# Patient Record
Sex: Female | Born: 1980 | Race: Black or African American | Hispanic: No | Marital: Single | Smoking: Never smoker
Health system: Southern US, Community
[De-identification: ages and names within clinical notes are randomized; demographics above are authoritative.]

## PROBLEM LIST (undated history)

## (undated) DIAGNOSIS — Z98891 History of uterine scar from previous surgery: Secondary | ICD-10-CM

## (undated) DIAGNOSIS — T7840XA Allergy, unspecified, initial encounter: Secondary | ICD-10-CM

## (undated) DIAGNOSIS — J45909 Unspecified asthma, uncomplicated: Secondary | ICD-10-CM

## (undated) HISTORY — DX: Unspecified asthma, uncomplicated: J45.909

## (undated) HISTORY — PX: NO PAST SURGERIES: SHX2092

## (undated) HISTORY — DX: Allergy, unspecified, initial encounter: T78.40XA

---

## 2011-08-03 ENCOUNTER — Ambulatory Visit (INDEPENDENT_AMBULATORY_CARE_PROVIDER_SITE_OTHER): Payer: BC Managed Care – PPO | Admitting: Family Medicine

## 2011-08-03 VITALS — BP 111/74 | HR 82 | Temp 98.0°F | Resp 16 | Ht 63.5 in | Wt 157.0 lb

## 2011-08-03 DIAGNOSIS — K5900 Constipation, unspecified: Secondary | ICD-10-CM

## 2011-08-03 DIAGNOSIS — T7589XA Other specified effects of external causes, initial encounter: Secondary | ICD-10-CM

## 2011-08-03 DIAGNOSIS — IMO0001 Reserved for inherently not codable concepts without codable children: Secondary | ICD-10-CM

## 2011-08-03 DIAGNOSIS — R102 Pelvic and perineal pain: Secondary | ICD-10-CM

## 2011-08-03 LAB — POCT CBC
Granulocyte percent: 65.7 %G (ref 37–80)
MCH, POC: 29.6 pg (ref 27–31.2)
MCV: 92.8 fL (ref 80–97)
MID (cbc): 0.5 (ref 0–0.9)
POC LYMPH PERCENT: 29 %L (ref 10–50)
POC MID %: 5.3 %M (ref 0–12)
Platelet Count, POC: 295 10*3/uL (ref 142–424)
RDW, POC: 13.5 %

## 2011-08-03 LAB — POCT URINALYSIS DIPSTICK
Blood, UA: NEGATIVE
Protein, UA: NEGATIVE
Spec Grav, UA: 1.02
Urobilinogen, UA: 0.2

## 2011-08-03 LAB — POCT UA - MICROSCOPIC ONLY
Crystals, Ur, HPF, POC: NEGATIVE
Yeast, UA: NEGATIVE

## 2011-08-03 LAB — POCT WET PREP WITH KOH
KOH Prep POC: NEGATIVE
Trichomonas, UA: NEGATIVE

## 2011-08-03 LAB — HIV ANTIBODY (ROUTINE TESTING W REFLEX): HIV: NONREACTIVE

## 2011-08-03 LAB — POCT URINE PREGNANCY: Preg Test, Ur: NEGATIVE

## 2011-08-03 MED ORDER — FLUCONAZOLE 150 MG PO TABS: 150.0000 mg | ORAL_TABLET | Freq: Once | ORAL | Status: AC

## 2011-08-03 MED ORDER — METRONIDAZOLE 500 MG PO TABS: 500.0000 mg | ORAL_TABLET | Freq: Two times a day (BID) | ORAL | Status: AC

## 2011-08-03 NOTE — Progress Notes (Signed)
  Subjective:    Patient ID: Sara Newton, female    DOB: 1980/04/22, 31 y.o.   MRN: [id number]  HPI  Patient presents with 1-2 weeks of intermittent suprapubic pain.  Light vaginal discharge  Denies urinary symptoms Denies constipation though  last normal BM 1 week ago No history of ovarian cysts  Significant stressor  Review of Systems     Objective:   Physical Exam  Neck: Neck supple.  Cardiovascular: Normal rate, regular rhythm and normal heart sounds.   Pulmonary/Chest: Effort normal and breath sounds normal.  Abdominal: Soft. There is Tenderness: (B) lower quandrant pain.Marland Kitchen  Neurological: She is alert.  Skin: Skin is warm.           Assessment & Plan:   1. Pelvic pain secondary to BV  POCT CBC, POCT urinalysis dipstick, POCT UA - Microscopic Only, POCT Wet Prep with KOH, POCT urine pregnancy, metroNIDAZOLE (FLAGYL) 500 MG tablet, fluconazole (DIFLUCAN) 150 MG tablet  2. Exposure  GC/chlamydia probe amp, urine, HIV antibody, RPR  3. Constipation

## 2011-08-04 LAB — GC/CHLAMYDIA PROBE AMP, URINE: GC Probe Amp, Urine: NEGATIVE

## 2011-08-05 ENCOUNTER — Encounter: Payer: Self-pay | Admitting: Family Medicine

## 2011-12-31 ENCOUNTER — Other Ambulatory Visit: Payer: Self-pay | Admitting: Physician Assistant

## 2011-12-31 NOTE — Telephone Encounter (Signed)
Chart pulled to PA pool at nurses station [phone]

## 2011-12-31 NOTE — Telephone Encounter (Signed)
Please pull chart.

## 2012-02-01 ENCOUNTER — Other Ambulatory Visit: Payer: Self-pay | Admitting: Physician Assistant

## 2012-02-20 ENCOUNTER — Ambulatory Visit (INDEPENDENT_AMBULATORY_CARE_PROVIDER_SITE_OTHER): Payer: BC Managed Care – PPO | Admitting: Emergency Medicine

## 2012-02-20 VITALS — BP 135/83 | HR 89 | Temp 98.3°F | Resp 16 | Ht 64.0 in | Wt 161.2 lb

## 2012-02-20 DIAGNOSIS — N760 Acute vaginitis: Secondary | ICD-10-CM

## 2012-02-20 DIAGNOSIS — R079 Chest pain, unspecified: Secondary | ICD-10-CM

## 2012-02-20 DIAGNOSIS — Z0000 Encounter for general adult medical examination without abnormal findings: Secondary | ICD-10-CM

## 2012-02-20 DIAGNOSIS — Z0289 Encounter for other administrative examinations: Secondary | ICD-10-CM

## 2012-02-20 DIAGNOSIS — E559 Vitamin D deficiency, unspecified: Secondary | ICD-10-CM

## 2012-02-20 DIAGNOSIS — N898 Other specified noninflammatory disorders of vagina: Secondary | ICD-10-CM

## 2012-02-20 LAB — POCT CBC
Hemoglobin: 13.3 g/dL (ref 12.2–16.2)
Lymph, poc: 2.7 (ref 0.6–3.4)
MCH, POC: 29.8 pg (ref 27–31.2)
MCHC: 31.3 g/dL — AB (ref 31.8–35.4)
MCV: 95 fL (ref 80–97)
POC MID %: 6.3 %M (ref 0–12)
RBC: 4.47 M/uL (ref 4.04–5.48)
WBC: 6.6 10*3/uL (ref 4.6–10.2)

## 2012-02-20 LAB — POCT UA - MICROSCOPIC ONLY
Casts, Ur, LPF, POC: NEGATIVE
Crystals, Ur, HPF, POC: NEGATIVE
Mucus, UA: POSITIVE
Yeast, UA: NEGATIVE

## 2012-02-20 LAB — COMPREHENSIVE METABOLIC PANEL
ALT: 16 U/L (ref 0–35)
Albumin: 4.4 g/dL (ref 3.5–5.2)
CO2: 26 mEq/L (ref 19–32)
Calcium: 9.2 mg/dL (ref 8.4–10.5)
Chloride: 105 mEq/L (ref 96–112)
Glucose, Bld: 83 mg/dL (ref 70–99)
Potassium: 4.5 mEq/L (ref 3.5–5.3)
Sodium: 137 mEq/L (ref 135–145)
Total Bilirubin: 0.3 mg/dL (ref 0.3–1.2)
Total Protein: 7.2 g/dL (ref 6.0–8.3)

## 2012-02-20 LAB — POCT WET PREP WITH KOH
KOH Prep POC: NEGATIVE
Trichomonas, UA: NEGATIVE

## 2012-02-20 LAB — POCT URINALYSIS DIPSTICK
Bilirubin, UA: NEGATIVE
Glucose, UA: NEGATIVE
Spec Grav, UA: 1.015
Urobilinogen, UA: 0.2

## 2012-02-20 LAB — LIPID PANEL
Cholesterol: 162 mg/dL (ref 0–200)
VLDL: 7 mg/dL (ref 0–40)

## 2012-02-20 LAB — HIV ANTIBODY (ROUTINE TESTING W REFLEX): HIV: NONREACTIVE

## 2012-02-20 LAB — TSH: TSH: 1.91 u[IU]/mL (ref 0.350–4.500)

## 2012-02-20 MED ORDER — METRONIDAZOLE 500 MG PO TABS: 500.0000 mg | ORAL_TABLET | Freq: Two times a day (BID) | ORAL | Status: DC

## 2012-02-20 MED ORDER — LEVONORGESTREL-ETHINYL ESTRAD 0.1-20 MG-MCG PO TABS: 1.0000 | ORAL_TABLET | Freq: Every day | ORAL | Status: DC

## 2012-02-20 NOTE — Addendum Note (Signed)
Addended by: Cira Rue R on: 02/20/2012 11:45 AM   Modules accepted: Orders

## 2012-02-20 NOTE — Progress Notes (Signed)
[facility] [address], [place] [address] [phone]  Date:  02/20/2012   Name:  Sara Newton   DOB:  March 26, 1980   MRN:  [id number]  PCP:  No primary provider on file.    Chief Complaint: Annual Exam   History of Present Illness:  Sara Newton is a 31 y.o. very pleasant female patient who presents with the following:  Two issues; requests an annual physical and describes recent onset of chest discomfort that has been intermittent.  Says that it has been problematic for the past week or so.  Has experienced similar episodes in past.  Not associated with tachycardia or palpitations, shortness of breath, wheezing, nausea, diaphoresis.  No cough or coryza.  History of asthma that only responds to maxair MDI and nebulized aerosol treatments.  Last used her neb a month ago.  No family history of accelerated cardiac disease.  Patient nonsmoker, no history of immobilization or travel or surgery.    Denies other concerns or complaints referable to her annual exam.  There is no problem list on file for this patient.   Past Medical History  Diagnosis Date  . Allergy   . Asthma     No past surgical history on file.  History  Substance Use Topics  . Smoking status: Never Smoker   . Smokeless tobacco: Not on file  . Alcohol Use: Not on file    Family History  Problem Relation Age of Onset  . Hypertension Mother   . Hypertension Father   . Dementia Paternal Grandfather     No Known Allergies  Medication list has been reviewed and updated.  Current Outpatient Prescriptions on File Prior to Visit  Medication Sig Dispense Refill  . albuterol (PROVENTIL) (2.5 MG/3ML) 0.083% nebulizer solution Take 2.5 mg by nebulization every 6 (six) hours as needed.      . fexofenadine (ALLEGRA) 180 MG tablet Take 180 mg by mouth daily.      . mometasone (NASONEX) 50 MCG/ACT nasal spray Place 2 sprays into the nose daily.      . ORSYTHIA 0.1-20 MG-MCG  tablet TAKE 1 TABLET BY MOUTH DAILY. NEEDS PHYSICAL/LABS  28 tablet  0    Review of Systems:  As per HPI, otherwise negative.    Physical Examination: Filed Vitals:   02/20/12 1019  BP: 135/83  Pulse: 89  Temp: 98.3 F (36.8 C)  Resp: 16   Filed Vitals:   02/20/12 1019  Height: 5\' 4"  (1.626 m)  Weight: 161 lb 3.2 oz (73.12 kg)   Body mass index is 27.67 kg/(m^2). Ideal Body Weight: Weight in (lb) to have BMI = 25: 145.3   GEN: WDWN, NAD, Non-toxic, A & O x 3 HEENT: Atraumatic, Normocephalic. Neck supple. No masses, No LAD. Ears and Nose: No external deformity. CV: RRR, No M/G/R. No JVD. No thrill. No extra heart sounds. PULM: CTA B, no wheezes, crackles, rhonchi. No retractions. No resp. distress. No accessory muscle use. ABD: S, NT, ND, +BS. No rebound. No HSM. EXTR: No c/c/e NEURO Normal gait.  PSYCH: Normally interactive. Conversant. Not depressed or anxious appearing.  Calm demeanor.   Pelvic - normal external genitalia, vulva, vagina, cervix, uterus and adnexa. Thick white vaginal discharge   Assessment and Plan: Chest pain EKG and Echo Labs  Physical Labs PAP  Vaginal discharge Wet prep  Carmelina Dane, MD

## 2012-02-20 NOTE — Addendum Note (Signed)
Addended by: Carmelina Dane on: 02/20/2012 11:42 AM   Modules accepted: Orders

## 2012-02-21 DIAGNOSIS — R079 Chest pain, unspecified: Secondary | ICD-10-CM | POA: Insufficient documentation

## 2012-02-21 DIAGNOSIS — N760 Acute vaginitis: Secondary | ICD-10-CM | POA: Insufficient documentation

## 2012-02-21 DIAGNOSIS — E559 Vitamin D deficiency, unspecified: Secondary | ICD-10-CM | POA: Insufficient documentation

## 2012-02-21 LAB — VITAMIN D 25 HYDROXY (VIT D DEFICIENCY, FRACTURES): Vit D, 25-Hydroxy: 21 ng/mL — ABNORMAL LOW (ref 30–89)

## 2012-02-21 MED ORDER — ERGOCALCIFEROL 1.25 MG (50000 UT) PO CAPS: 50000.0000 [IU] | ORAL_CAPSULE | ORAL | Status: DC

## 2012-02-21 NOTE — Addendum Note (Signed)
Addended by: Carmelina Dane on: 02/21/2012 10:08 AM   Modules accepted: Orders

## 2012-02-23 LAB — PAP IG, CT-NG, RFX HPV ASCU: GC Probe Amp: NEGATIVE

## 2012-03-11 ENCOUNTER — Ambulatory Visit (HOSPITAL_COMMUNITY): Payer: BC Managed Care – PPO | Attending: Cardiology | Admitting: Radiology

## 2012-03-11 DIAGNOSIS — I059 Rheumatic mitral valve disease, unspecified: Secondary | ICD-10-CM | POA: Insufficient documentation

## 2012-03-11 DIAGNOSIS — R079 Chest pain, unspecified: Secondary | ICD-10-CM

## 2012-03-11 DIAGNOSIS — I079 Rheumatic tricuspid valve disease, unspecified: Secondary | ICD-10-CM | POA: Insufficient documentation

## 2012-03-11 DIAGNOSIS — R072 Precordial pain: Secondary | ICD-10-CM | POA: Insufficient documentation

## 2012-03-11 NOTE — Progress Notes (Signed)
Echocardiogram performed.  

## 2012-05-23 ENCOUNTER — Ambulatory Visit (INDEPENDENT_AMBULATORY_CARE_PROVIDER_SITE_OTHER): Payer: BC Managed Care – PPO | Admitting: Emergency Medicine

## 2012-05-23 VITALS — BP 112/78 | HR 86 | Temp 98.2°F | Resp 16 | Ht 63.0 in | Wt 163.0 lb

## 2012-05-23 DIAGNOSIS — M25521 Pain in right elbow: Secondary | ICD-10-CM

## 2012-05-23 LAB — POCT CBC
Granulocyte percent: 50.1 %G (ref 37–80)
HCT, POC: 38.5 % (ref 37.7–47.9)
Lymph, poc: 2.9 (ref 0.6–3.4)
MCHC: 30.9 g/dL — AB (ref 31.8–35.4)
MID (cbc): 0.7 (ref 0–0.9)
POC Granulocyte: 3.6 (ref 2–6.9)
POC LYMPH PERCENT: 40.5 %L (ref 10–50)
POC MID %: 9.4 %M (ref 0–12)
Platelet Count, POC: 234 10*3/uL (ref 142–424)
RDW, POC: 12.9 %

## 2012-05-23 MED ORDER — NAPROXEN SODIUM 550 MG PO TABS: 550.0000 mg | ORAL_TABLET | Freq: Two times a day (BID) | ORAL | Status: DC

## 2012-05-23 NOTE — Patient Instructions (Addendum)
Olecranon Bursitis Bursitis is swelling and soreness (inflammation) of a fluid-filled sac (bursa) that covers and protects a joint. Olecranon bursitis occurs over the elbow.  CAUSES Bursitis can be caused by injury, overuse of the joint, arthritis, or infection.  SYMPTOMS   Tenderness, swelling, warmth, or redness over the elbow.  Elbow pain with movement. This is greater with bending the elbow.  Squeaking sound when the bursa is rubbed or moved.  Increasing size of the bursa without pain or discomfort.  Fever with increasing pain and swelling if the bursa becomes infected. HOME CARE INSTRUCTIONS   Put ice on the affected area.  Put ice in a plastic bag.  Place a towel between your skin and the bag.  Leave the ice on for 15 to 20 minutes each hour while awake. Do this for the first 2 days.  When resting, elevate your elbow above the level of your heart. This helps reduce swelling.  Continue to put the joint through a full range of motion 4 times per day. Rest the injured joint at other times. When the pain lessens, begin normal slow movements and usual activities.  Only take over-the-counter or prescription medicines for pain, discomfort, or fever as directed by your caregiver.  Reduce your intake of milk and related dairy products (cheese, yogurt). They may make your condition worse. SEEK IMMEDIATE MEDICAL CARE IF:   Your pain increases even during treatment.  You have a fever.  You have heat and inflammation over the bursa and elbow.  You have a red line that goes up your arm.  You have pain with movement of your elbow. MAKE SURE YOU:   Understand these instructions.  Will watch your condition.  Will get help right away if you are not doing well or get worse. Document Released: 03/26/2006 Document Revised: 05/19/2011 Document Reviewed: 02/09/2007 [employer] Patient Information 2013 [employer].  

## 2012-05-23 NOTE — Progress Notes (Signed)
[facility] [address] [place] [address] [phone]  Date:  05/23/2012   Name:  Sara Newton   DOB:  Nov 25, 1980   MRN:  [id number]  PCP:  No primary provider on file.    Chief Complaint: Rash   History of Present Illness:  Sara Newton is a 32 y.o. very pleasant female patient who presents with the following:  Sudden swelling and redness of right olecranon with effusion.  Redness and swelling have actually improved since yesterday.  No lymphangitis or fever or chills. No history of injury or overuse or antecedent infection.  No improvement with over the counter medications or other home remedies. Denies other complaint or health concern today.   Patient Active Problem List  Diagnosis  . Hypovitaminosis D  . BV (bacterial vaginosis)  . Chest pain    Past Medical History  Diagnosis Date  . Allergy   . Asthma     History reviewed. No pertinent past surgical history.  History  Substance Use Topics  . Smoking status: Never Smoker   . Smokeless tobacco: Not on file  . Alcohol Use: Yes     Comment: socially    Family History  Problem Relation Age of Onset  . Hypertension Mother   . Hypertension Father   . Dementia Paternal Grandfather   . Hypertension Sister     No Known Allergies  Medication list has been reviewed and updated.  Current Outpatient Prescriptions on File Prior to Visit  Medication Sig Dispense Refill  . albuterol (PROVENTIL) (2.5 MG/3ML) 0.083% nebulizer solution Take 2.5 mg by nebulization every 6 (six) hours as needed.      . ergocalciferol (VITAMIN D2) 50000 UNITS capsule Take 1 capsule (50,000 Units total) by mouth once a week.  4 capsule  12  . fexofenadine (ALLEGRA) 180 MG tablet Take 180 mg by mouth daily.      Marland Kitchen levonorgestrel-ethinyl estradiol (ORSYTHIA) 0.1-20 MG-MCG tablet Take 1 tablet by mouth daily. TAKE 1 TABLET BY MOUTH DAILY.  28 tablet  12  . mometasone (NASONEX) 50 MCG/ACT nasal spray Place  2 sprays into the nose daily.      . metroNIDAZOLE (FLAGYL) 500 MG tablet Take 1 tablet (500 mg total) by mouth 2 (two) times daily with a meal. DO NOT CONSUME ALCOHOL WHILE TAKING THIS MEDICATION.  14 tablet  0   No current facility-administered medications on file prior to visit.    Review of Systems:  As per HPI, otherwise negative.    Physical Examination: Filed Vitals:   05/23/12 1654  BP: 112/78  Pulse: 86  Temp: 98.2 F (36.8 C)  Resp: 16   Filed Vitals:   05/23/12 1654  Height: 5\' 3"  (1.6 m)  Weight: 163 lb (73.936 kg)   Body mass index is 28.88 kg/(m^2). Ideal Body Weight: Weight in (lb) to have BMI = 25: 140.8   GEN: WDWN, NAD, Non-toxic, Alert & Oriented x 3 HEENT: Atraumatic, Normocephalic.  Ears and Nose: No external deformity. EXTR: No clubbing/cyanosis/edema NEURO: Normal gait.  PSYCH: Normally interactive. Conversant. Not depressed or anxious appearing.  Calm demeanor.  RIGHT ELBOW:  Moderate effusion olecranon with erythema surrounding.  No tenderness or lymphangitis  Assessment and Plan: Olecranon bursitis Anaprox Local heat Follow up in one week  Carmelina Dane, MD  Results for orders placed in visit on 05/23/12  POCT CBC      Result Value Range   WBC 7.1  4.6 -  10.2 K/uL   Lymph, poc 2.9  0.6 - 3.4   POC LYMPH PERCENT 40.5  10 - 50 %L   MID (cbc) 0.7  0 - 0.9   POC MID % 9.4  0 - 12 %M   POC Granulocyte 3.6  2 - 6.9   Granulocyte percent 50.1  37 - 80 %G   RBC 4.03 (*) 4.04 - 5.48 M/uL   Hemoglobin 11.9 (*) 12.2 - 16.2 g/dL   HCT, POC 45.4  09.8 - 47.9 %   MCV 95.5  80 - 97 fL   MCH, POC 29.5  27 - 31.2 pg   MCHC 30.9 (*) 31.8 - 35.4 g/dL   RDW, POC 11.9     Platelet Count, POC 234  142 - 424 K/uL   MPV 11.3  0 - 99.8 fL

## 2012-10-14 ENCOUNTER — Emergency Department (HOSPITAL_COMMUNITY): Payer: BC Managed Care – PPO

## 2012-10-14 ENCOUNTER — Encounter (HOSPITAL_COMMUNITY): Payer: Self-pay | Admitting: Emergency Medicine

## 2012-10-14 ENCOUNTER — Emergency Department (HOSPITAL_COMMUNITY)
Admission: EM | Admit: 2012-10-14 | Discharge: 2012-10-14 | Disposition: A | Discharge status: Home / Self Care | Payer: BC Managed Care – PPO | Attending: Emergency Medicine | Admitting: Emergency Medicine

## 2012-10-14 DIAGNOSIS — M549 Dorsalgia, unspecified: Secondary | ICD-10-CM

## 2012-10-14 DIAGNOSIS — M545 Low back pain, unspecified: Secondary | ICD-10-CM | POA: Insufficient documentation

## 2012-10-14 DIAGNOSIS — J45909 Unspecified asthma, uncomplicated: Secondary | ICD-10-CM | POA: Insufficient documentation

## 2012-10-14 DIAGNOSIS — Z79899 Other long term (current) drug therapy: Secondary | ICD-10-CM | POA: Insufficient documentation

## 2012-10-14 DIAGNOSIS — R109 Unspecified abdominal pain: Secondary | ICD-10-CM | POA: Insufficient documentation

## 2012-10-14 DIAGNOSIS — R52 Pain, unspecified: Secondary | ICD-10-CM | POA: Insufficient documentation

## 2012-10-14 DIAGNOSIS — Z3202 Encounter for pregnancy test, result negative: Secondary | ICD-10-CM | POA: Insufficient documentation

## 2012-10-14 LAB — WET PREP, GENITAL: Yeast Wet Prep HPF POC: NONE SEEN

## 2012-10-14 LAB — CBC
MCH: 31.7 pg (ref 26.0–34.0)
MCV: 92.7 fL (ref 78.0–100.0)
Platelets: 240 10*3/uL (ref 150–400)
RDW: 13.2 % (ref 11.5–15.5)

## 2012-10-14 LAB — COMPREHENSIVE METABOLIC PANEL
AST: 16 U/L (ref 0–37)
Albumin: 3.9 g/dL (ref 3.5–5.2)
Calcium: 8.9 mg/dL (ref 8.4–10.5)
Creatinine, Ser: 0.76 mg/dL (ref 0.50–1.10)
Sodium: 140 mEq/L (ref 135–145)
Total Protein: 7.4 g/dL (ref 6.0–8.3)

## 2012-10-14 LAB — URINALYSIS, ROUTINE W REFLEX MICROSCOPIC
Glucose, UA: NEGATIVE mg/dL
Hgb urine dipstick: NEGATIVE
Specific Gravity, Urine: 1.019 (ref 1.005–1.030)
Urobilinogen, UA: 0.2 mg/dL (ref 0.0–1.0)
pH: 7 (ref 5.0–8.0)

## 2012-10-14 LAB — POCT PREGNANCY, URINE: Preg Test, Ur: NEGATIVE

## 2012-10-14 MED ORDER — HYDROCODONE-ACETAMINOPHEN 5-325 MG PO TABS
2.0000 | ORAL_TABLET | Freq: Once | ORAL | Status: AC
  Administered 2012-10-14: 2 via ORAL
  Filled 2012-10-14: qty 2

## 2012-10-14 MED ORDER — KETOROLAC TROMETHAMINE 60 MG/2ML IM SOLN
60.0000 mg | Freq: Once | INTRAMUSCULAR | Status: AC
  Administered 2012-10-14: 60 mg via INTRAMUSCULAR
  Filled 2012-10-14: qty 2

## 2012-10-14 MED ORDER — CYCLOBENZAPRINE HCL 10 MG PO TABS
5.0000 mg | ORAL_TABLET | Freq: Once | ORAL | Status: AC
  Administered 2012-10-14: 5 mg via ORAL
  Filled 2012-10-14: qty 1

## 2012-10-14 MED ORDER — HYDROCODONE-ACETAMINOPHEN 5-325 MG PO TABS: 2.0000 | ORAL_TABLET | ORAL | Status: DC | PRN

## 2012-10-14 MED ORDER — CYCLOBENZAPRINE HCL 10 MG PO TABS: 10.0000 mg | ORAL_TABLET | Freq: Two times a day (BID) | ORAL | Status: DC | PRN

## 2012-10-14 NOTE — Progress Notes (Signed)
Patient confirms she does not have a pcp.  Patient confirms she has Acupuncturist for insurance.  Instructed patient to call the telephone number on the back of her insurance card or go the insurance carrier website to help her find a pcp within network.  Patient verbalized understanding.

## 2012-10-14 NOTE — ED Provider Notes (Signed)
CSN: [id number]     Arrival date & time 10/14/12  1740 History     First MD Initiated Contact with Patient 10/14/12 1748     Chief Complaint  Patient presents with  . Flank Pain  . Back Pain   (Consider location/radiation/quality/duration/timing/severity/associated sxs/prior Treatment) HPI Comments: Pt presents w/ BL low back pain w/ radiation around lower abdomen, intermittent for 3 weeks, but much worse suddenly today while doing pilates.  Denies fever, chills, n/v, d/a, dysuria, vag bleeding or d/c.   Patient is a 32 y.o. female presenting with flank pain and back pain. The history is provided by the patient. No language interpreter was used.  Flank Pain This is a new problem. The current episode started more than 1 week ago (3 weeks). The problem occurs daily. The problem has been rapidly worsening. Associated symptoms include abdominal pain. Pertinent negatives include no chest pain, no headaches and no shortness of breath. The symptoms are aggravated by twisting. The symptoms are relieved by acetaminophen. She has tried acetaminophen for the symptoms. The treatment provided mild relief.  Back Pain Associated symptoms: abdominal pain   Associated symptoms: no chest pain, no dysuria, no fever, no headaches, no numbness, no pelvic pain and no weakness     Past Medical History  Diagnosis Date  . Allergy   . Asthma    History reviewed. No pertinent past surgical history. Family History  Problem Relation Age of Onset  . Hypertension Mother   . Hypertension Father   . Dementia Paternal Grandfather   . Hypertension Sister    History  Substance Use Topics  . Smoking status: Never Smoker   . Smokeless tobacco: Not on file  . Alcohol Use: Yes     Comment: socially   OB History   Grav Para Term Preterm Abortions TAB SAB Ect Mult Living                 Review of Systems  Constitutional: Negative for fever, chills, diaphoresis, activity change, appetite change and fatigue.    HENT: Negative for congestion, sore throat, facial swelling, rhinorrhea, neck pain and neck stiffness.   Eyes: Negative for photophobia and discharge.  Respiratory: Negative for cough, chest tightness and shortness of breath.   Cardiovascular: Negative for chest pain, palpitations and leg swelling.  Gastrointestinal: Positive for abdominal pain. Negative for nausea, vomiting and diarrhea.  Endocrine: Negative for polydipsia and polyuria.  Genitourinary: Positive for flank pain. Negative for dysuria, frequency, difficulty urinating and pelvic pain.  Musculoskeletal: Positive for back pain. Negative for arthralgias.  Skin: Negative for color change and wound.  Allergic/Immunologic: Negative for immunocompromised state.  Neurological: Negative for facial asymmetry, weakness, numbness and headaches.  Hematological: Does not bruise/bleed easily.  Psychiatric/Behavioral: Negative for confusion and agitation.    Allergies  Review of patient's allergies indicates no known allergies.  Home Medications   Current Outpatient Rx  Name  Route  Sig  Dispense  Refill  . albuterol (PROVENTIL) (2.5 MG/3ML) 0.083% nebulizer solution   Nebulization   Take 2.5 mg by nebulization every 6 (six) hours as needed for wheezing or shortness of breath.          . fexofenadine (ALLEGRA) 180 MG tablet   Oral   Take 180 mg by mouth daily as needed (for allergies).          . mometasone (NASONEX) 50 MCG/ACT nasal spray   Nasal   Place 2 sprays into the nose daily.         Marland Kitchen  Multiple Vitamin (MULTIVITAMIN WITH MINERALS) TABS tablet   Oral   Take 1 tablet by mouth daily.         . pirbuterol (MAXAIR) 200 MCG/INH inhaler   Inhalation   Inhale 2 puffs into the lungs 4 (four) times daily as needed for wheezing or shortness of breath.         . thiamine 100 MG tablet   Oral   Take 100 mg by mouth daily.         . Vitamin D, Ergocalciferol, (DRISDOL) 50000 UNITS CAPS capsule   Oral   Take  50,000 Units by mouth every 7 (seven) days. Taken on Wednesdays.         . cyclobenzaprine (FLEXERIL) 10 MG tablet   Oral   Take 1 tablet (10 mg total) by mouth 2 (two) times daily as needed for muscle spasms.   20 tablet   0   . HYDROcodone-acetaminophen (NORCO) 5-325 MG per tablet   Oral   Take 2 tablets by mouth every 4 (four) hours as needed for pain.   15 tablet   0    BP 128/74  Pulse 75  Temp(Src) 98.5 F (36.9 C) (Oral)  Resp 20  SpO2 100%  LMP 10/03/2012 Physical Exam  Constitutional: She is oriented to person, place, and time. She appears well-developed and well-nourished. No distress.  HENT:  Head: Normocephalic and atraumatic.  Mouth/Throat: No oropharyngeal exudate.  Eyes: Pupils are equal, round, and reactive to light.  Neck: Normal range of motion. Neck supple.  Cardiovascular: Normal rate, regular rhythm and normal heart sounds.  Exam reveals no gallop and no friction rub.   No murmur heard. Pulmonary/Chest: Effort normal and breath sounds normal. No respiratory distress. She has no wheezes. She has no rales.  Abdominal: Soft. Bowel sounds are normal. She exhibits no distension and no mass. There is tenderness in the epigastric area and suprapubic area. There is no rigidity, no rebound and no guarding.  Genitourinary: Vagina normal and uterus normal. Cervix exhibits discharge (small amt, white). Cervix exhibits no motion tenderness and no friability. Right adnexum displays no tenderness and no fullness. Left adnexum displays no tenderness and no fullness. No tenderness or bleeding around the vagina.  Musculoskeletal: Normal range of motion. She exhibits no edema and no tenderness.       Back:  Neurological: She is alert and oriented to person, place, and time.  Skin: Skin is warm and dry.  Psychiatric: She has a normal mood and affect.    ED Course   Procedures (including critical care time)  Labs Reviewed  WET PREP, GENITAL - Abnormal; Notable for the  following:    Clue Cells Wet Prep HPF POC RARE (*)    WBC, Wet Prep HPF POC RARE (*)    All other components within normal limits  URINE CULTURE  GC/CHLAMYDIA PROBE AMP  CBC  COMPREHENSIVE METABOLIC PANEL  URINALYSIS, ROUTINE W REFLEX MICROSCOPIC  POCT PREGNANCY, URINE   Ct Abdomen Pelvis Wo Contrast  10/14/2012   *RADIOLOGY REPORT*  Clinical Data: Bilateral flank pain, back pain.  CT ABDOMEN AND PELVIS WITHOUT CONTRAST  Technique:  Multidetector CT imaging of the abdomen and pelvis was performed following the standard protocol without intravenous contrast.  Comparison: None.  Findings: Lung bases are clear.  No effusions.  Heart is normal size.  Liver, gallbladder, spleen, pancreas, adrenals and kidneys have an unremarkable unenhanced appearance.  No renal or ureteral stones. No hydronephrosis.  Urinary bladder is  unremarkable.  Bowel grossly unremarkable.  No free fluid, free air, or adenopathy.  Uterus and adnexa have an unremarkable unenhanced appearance. Appendix is visualized and is normal.  Aorta is normal caliber.  IMPRESSION: Unremarkable unenhanced study.   Original Report Authenticated By: Charlett Nose, M.D.   1. Back pain, acute     MDM  Pt is a 32 y.o. female with Pmhx as above who presents with 3 weeks intermittent low back pain radiating around lower abdomen, much worse today.  On exam, VSS, in NAD, though appears uncomfortabel & has difficulty sitting down.  Mild epigastic & suprapubic ttp, +low back ttp w/o CVA tenderness.  Ddx considered included UTI, PID, nephrolithiasis, MSK pain.  CT stone study, CBC, BMP, UA, wet prep unremarkable.  Pelvic exam benign.  Will d/c home w/ trial of norco/flexeril for pain and strict return precautions for new or worsening  Symptoms.   1. Back pain, acute       Shanna Cisco, MD 10/15/12 [phone]

## 2012-10-14 NOTE — ED Notes (Signed)
Pt here for c/o bilat flank pain and mid back pain started mildly a week ago pain increased today took Tylenol with some relief.

## 2012-10-15 LAB — URINE CULTURE
Colony Count: NO GROWTH
Culture: NO GROWTH

## 2013-01-12 ENCOUNTER — Ambulatory Visit (INDEPENDENT_AMBULATORY_CARE_PROVIDER_SITE_OTHER): Payer: BC Managed Care – PPO | Admitting: Family Medicine

## 2013-01-12 VITALS — BP 102/74 | HR 72 | Temp 98.4°F | Resp 16 | Ht 63.0 in | Wt 151.2 lb

## 2013-01-12 DIAGNOSIS — Z0000 Encounter for general adult medical examination without abnormal findings: Secondary | ICD-10-CM

## 2013-01-12 DIAGNOSIS — Z113 Encounter for screening for infections with a predominantly sexual mode of transmission: Secondary | ICD-10-CM

## 2013-01-12 DIAGNOSIS — E559 Vitamin D deficiency, unspecified: Secondary | ICD-10-CM

## 2013-01-12 DIAGNOSIS — N898 Other specified noninflammatory disorders of vagina: Secondary | ICD-10-CM

## 2013-01-12 DIAGNOSIS — R8290 Unspecified abnormal findings in urine: Secondary | ICD-10-CM

## 2013-01-12 LAB — POCT URINALYSIS DIPSTICK
Bilirubin, UA: NEGATIVE
Blood, UA: NEGATIVE
Glucose, UA: NEGATIVE
Ketones, UA: NEGATIVE
Nitrite, UA: NEGATIVE
Protein, UA: 30
Spec Grav, UA: 1.015
Urobilinogen, UA: 0.2
pH, UA: 8.5

## 2013-01-12 LAB — POCT UA - MICROSCOPIC ONLY
Casts, Ur, LPF, POC: NEGATIVE
Crystals, Ur, HPF, POC: NEGATIVE
Yeast, UA: NEGATIVE

## 2013-01-12 LAB — POCT CBC
Granulocyte percent: 47.4 %G (ref 37–80)
HCT, POC: 39.8 % (ref 37.7–47.9)
Hemoglobin: 12.2 g/dL (ref 12.2–16.2)
Lymph, poc: 3.3 (ref 0.6–3.4)
MCH, POC: 30.3 pg (ref 27–31.2)
MCHC: 30.7 g/dL — AB (ref 31.8–35.4)
MCV: 98.8 fL — AB (ref 80–97)
MID (cbc): 0.6 (ref 0–0.9)
MPV: 10.3 fL (ref 0–99.8)
POC Granulocyte: 3.5 (ref 2–6.9)
POC LYMPH PERCENT: 45 %L (ref 10–50)
POC MID %: 7.6 %M (ref 0–12)
Platelet Count, POC: 204 10*3/uL (ref 142–424)
RBC: 4.03 M/uL — AB (ref 4.04–5.48)
RDW, POC: 13.6 %
WBC: 7.3 10*3/uL (ref 4.6–10.2)

## 2013-01-12 LAB — POCT WET PREP WITH KOH
KOH Prep POC: NEGATIVE
Trichomonas, UA: NEGATIVE
Yeast Wet Prep HPF POC: NEGATIVE

## 2013-01-12 MED ORDER — MOMETASONE FUROATE 50 MCG/ACT NA SUSP: 2.0000 | Freq: Every day | NASAL | Status: DC

## 2013-01-12 MED ORDER — LEVONORGESTREL-ETHINYL ESTRAD 0.1-20 MG-MCG PO TABS: 1.0000 | ORAL_TABLET | Freq: Every day | ORAL | Status: DC

## 2013-01-12 MED ORDER — PIRBUTEROL ACETATE 200 MCG/INH IN AERB: 2.0000 | INHALATION_SPRAY | Freq: Four times a day (QID) | RESPIRATORY_TRACT | Status: DC | PRN

## 2013-01-12 NOTE — Progress Notes (Signed)
[facility]:  Office Visit  Chief Complaint:  Chief Complaint  Patient presents with  . Annual Exam    with annual pap, STD testing, annual blood work  . Contraception    orsythia  . Vaginitis    discharge, started monday    HPI: Sara Newton is a 32 y.o. 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 Allergy   . Asthma    History reviewed. No pertinent past surgical history. History   Social History  . Marital Status: Single    Spouse Name: N/A    Number of Children: N/A  . Years of Education: N/A   Occupational History  . Physics Professor    Social History Main Topics  . Smoking status: Never Smoker   . Smokeless tobacco: None  . Alcohol Use: Yes     Comment: socially  . Drug Use: No  . Sexual Activity: None   Other Topics Concern  . None   Social History Narrative  . None   Family History  Problem Relation Age of Onset  . Hypertension Mother   . Hypertension Father   . Dementia Paternal Grandfather   . Hypertension Sister    No Known Allergies Prior to Admission medications   Medication Sig Start Date End Date Taking? Authorizing Provider  albuterol (PROVENTIL) (2.5 MG/3ML) 0.083% nebulizer solution Take 2.5 mg by nebulization every 6 (six) hours as needed for wheezing or shortness of breath.    Yes Historical Provider, MD  mometasone (NASONEX) 50 MCG/ACT nasal spray Place 2 sprays into the nose daily.   Yes Historical Provider, MD  Multiple Vitamin (MULTIVITAMIN WITH MINERALS) TABS  tablet Take 1 tablet by mouth daily.   Yes Historical Provider, MD  pirbuterol (MAXAIR) 200 MCG/INH inhaler Inhale 2 puffs into the lungs 4 (four) times daily as needed for wheezing or shortness of breath.   Yes Historical Provider, MD  Vitamin D, Ergocalciferol, (DRISDOL) 50000 UNITS CAPS capsule Take 50,000 Units by mouth every 7 (seven) days. Taken on Wednesdays.   Yes Historical Provider, MD  cyclobenzaprine (FLEXERIL) 10 MG tablet Take 1 tablet (10 mg total) by mouth 2 (two) times daily as needed for muscle spasms. 10/14/12   Shanna Cisco, MD  fexofenadine (ALLEGRA) 180 MG tablet Take 180 mg by mouth daily as needed (for allergies).     Historical Provider, MD  HYDROcodone-acetaminophen (NORCO) 5-325 MG per tablet Take 2 tablets by mouth every 4 (four) hours as needed for pain. 10/14/12   Shanna Cisco, MD  thiamine 100 MG tablet Take 100 mg by mouth daily.    Historical Provider, MD     ROS: The patient denies fevers, chills, night sweats, unintentional weight loss, chest pain, palpitations, wheezing, dyspnea on exertion, nausea, vomiting, abdominal pain, dysuria, hematuria, melena, numbness, weakness, or tingling.   All other systems have been reviewed and were otherwise negative with the exception of those mentioned in the HPI and as above.    PHYSICAL EXAM: Filed Vitals:  01/12/13 1739  BP: 102/74  Pulse: 72  Temp: 98.4 F (36.9 C)  Resp: 16   Filed Vitals:   01/12/13 1739  Height: 5\' 3"  (1.6 m)  Weight: 151 lb 3.2 oz (68.584 kg)   Body mass index is 26.79 kg/(m^2).  General: Alert, no acute distress HEENT:  Normocephalic, atraumatic, oropharynx patent. EOMI, PERRLA Cardiovascular:  Regular rate and rhythm, no rubs murmurs or gallops.  No Carotid bruits, radial pulse intact. No pedal edema.  Respiratory: Clear to auscultation bilaterally.  No wheezes, rales, or rhonchi.  No cyanosis, no use of accessory musculature GI: No organomegaly, abdomen is soft and non-tender,  positive bowel sounds.  No masses. Skin: No rashes. Neurologic: Facial musculature symmetric. Psychiatric: Patient is appropriate throughout our interaction. Lymphatic: No cervical lymphadenopathy Musculoskeletal: Gait intact. Breast exam normal GU-cervix normal, no vag  odor, no masses, lesions, no CMT MInimal vag dc   LABS: Results for orders placed in visit on 01/12/13  GC/CHLAMYDIA PROBE AMP      Result Value Range   CT Probe RNA NEGATIVE     GC Probe RNA NEGATIVE    URINE CULTURE      Result Value Range   Colony Count 6,000 COLONIES/ML     Organism ID, Bacteria Insignificant Growth    COMPREHENSIVE METABOLIC PANEL      Result Value Range   Sodium 138  135 - 145 mEq/L   Potassium 4.6  3.5 - 5.3 mEq/L   Chloride 106  96 - 112 mEq/L   CO2 24  19 - 32 mEq/L   Glucose, Bld 84  70 - 99 mg/dL   BUN 14  6 - 23 mg/dL   Creat 4.54  0.98 - 1.19 mg/dL   Total Bilirubin 0.3  0.3 - 1.2 mg/dL   Alkaline Phosphatase 46  39 - 117 U/L   AST 15  0 - 37 U/L   ALT 12  0 - 35 U/L   Total Protein 7.3  6.0 - 8.3 g/dL   Albumin 4.4  3.5 - 5.2 g/dL   Calcium 9.3  8.4 - 14.7 mg/dL  HIV ANTIBODY (ROUTINE TESTING)      Result Value Range   HIV NON REACTIVE  NON REACTIVE  HSV(HERPES SIMPLEX VRS) I + II AB-IGG      Result Value Range   HSV 1 Glycoprotein G Ab, IgG       HSV 2 Glycoprotein G Ab, IgG      RPR      Result Value Range   RPR NON REAC  NON REAC  TSH      Result Value Range   TSH 2.753  0.350 - 4.500 uIU/mL  LDL CHOLESTEROL, DIRECT      Result Value Range   Direct LDL 87    VITAMIN D 25 HYDROXY      Result Value Range   Vit D, 25-Hydroxy 20 (*) 30 - 89 ng/mL  POCT CBC      Result Value Range   WBC 7.3  4.6 - 10.2 K/uL   Lymph, poc 3.3  0.6 - 3.4   POC LYMPH PERCENT 45.0  10 - 50 %L   MID (cbc) 0.6  0 - 0.9   POC MID % 7.6  0 - 12 %M   POC Granulocyte 3.5  2 - 6.9   Granulocyte percent 47.4  37 - 80 %G   RBC 4.03 (*) 4.04 - 5.48 M/uL   Hemoglobin 12.2  12.2 -  16.2  g/dL   HCT, POC 14.7  82.9 - 47.9 %   MCV 98.8 (*) 80 - 97 fL   MCH, POC 30.3  27 - 31.2 pg   MCHC 30.7 (*) 31.8 - 35.4 g/dL   RDW, POC 56.2     Platelet Count, POC 204  142 - 424 K/uL   MPV 10.3  0 - 99.8 fL  POCT UA - MICROSCOPIC ONLY      Result Value Range   WBC, Ur, HPF, POC 5-8     RBC, urine, microscopic 0-2     Bacteria, U Microscopic 2+     Mucus, UA small     Epithelial cells, urine per micros 5-10     Crystals, Ur, HPF, POC neg     Casts, Ur, LPF, POC neg     Yeast, UA neg    POCT URINALYSIS DIPSTICK      Result Value Range   Color, UA yellow     Clarity, UA cloudy     Glucose, UA neg     Bilirubin, UA neg     Ketones, UA neg     Spec Grav, UA 1.015     Blood, UA neg     pH, UA 8.5     Protein, UA 30     Urobilinogen, UA 0.2     Nitrite, UA neg     Leukocytes, UA small (1+)    POCT WET PREP WITH KOH      Result Value Range   Trichomonas, UA Negative     Clue Cells Wet Prep HPF POC 2-4     Epithelial Wet Prep HPF POC 2-4     Yeast Wet Prep HPF POC neg     Bacteria Wet Prep HPF POC 2+     RBC Wet Prep HPF POC 1-3     WBC Wet Prep HPF POC 5-8     KOH Prep POC Negative       EKG/XRAY:   Primary read interpreted by Dr. Conley Rolls at [facility].   ASSESSMENT/PLAN: Encounter Diagnoses  Name Primary?  . Annual physical exam Yes  . Screening for STD (sexually transmitted disease)   . Vaginal discharge   . Unspecified vitamin D deficiency   . Abnormal urine    Annual without pap since had one last year which was normal Refilled OCP, Maxair and nasonex Will await for urine cx, possible contaminant Labs pending F/u in 1 year Gross sideeffects, risk and benefits, and alternatives of medications d/w patient. Patient is aware that all medications have potential sideeffects and we are unable to predict every sideeffect or drug-drug interaction that may occur.  Ad Guttman PHUONG, DO 01/14/2013 12:12 PM   Spoke to patient about labs, rx albuterol and not maxair since  discontinued.

## 2013-01-12 NOTE — Patient Instructions (Signed)
Candida Infection, Adult A candida infection (also called yeast, fungus and Monilia infection) is an overgrowth of yeast that can occur anywhere on the body. A yeast infection commonly occurs in warm, moist body areas. Usually, the infection remains localized but can spread to become a systemic infection. A yeast infection may be a sign of a more severe disease such as diabetes, leukemia, or AIDS. A yeast infection can occur in both men and women. In women, Candida vaginitis is a vaginal infection. It is one of the most common causes of vaginitis. Men usually do not have symptoms or know they have an infection until other problems develop. Men may find out they have a yeast infection because their sex partner has a yeast infection. Uncircumcised men are more likely to get a yeast infection than circumcised men. This is because the uncircumcised glans is not exposed to air and does not remain as dry as that of a circumcised glans. Older adults may develop yeast infections around dentures. CAUSES  Women  Antibiotics.  Steroid medication taken for a long time.  Being overweight (obese).  Diabetes.  Poor immune condition.  Certain serious medical conditions.  Immune suppressive medications for organ transplant patients.  Chemotherapy.  Pregnancy.  Menstration.  Stress and fatigue.  Intravenous drug use.  Oral contraceptives.  Wearing tight-fitting clothes in the crotch area.  Catching it from a sex partner who has a yeast infection.  Spermicide.  Intravenous, urinary, or other catheters. Men  Catching it from a sex partner who has a yeast infection.  Having oral or anal sex with a person who has the infection.  Spermicide.  Diabetes.  Antibiotics.  Poor immune system.  Medications that suppress the immune system.  Intravenous drug use.  Intravenous, urinary, or other catheters. SYMPTOMS  Women  Thick, white vaginal discharge.  Vaginal itching.  Redness and  swelling in and around the vagina.  Irritation of the lips of the vagina and perineum.  Blisters on the vaginal lips and perineum.  Painful sexual intercourse.  Low blood sugar (hypoglycemia).  Painful urination.  Bladder infections.  Intestinal problems such as constipation, indigestion, bad breath, bloating, increase in gas, diarrhea, or loose stools. Men  Men may develop intestinal problems such as constipation, indigestion, bad breath, bloating, increase in gas, diarrhea, or loose stools.  Dry, cracked skin on the penis with itching or discomfort.  Jock itch.  Dry, flaky skin.  Athlete's foot.  Hypoglycemia. DIAGNOSIS  Women  A history and an exam are performed.  The discharge may be examined under a microscope.  A culture may be taken of the discharge. Men  A history and an exam are performed.  Any discharge from the penis or areas of cracked skin will be looked at under the microscope and cultured.  Stool samples may be cultured. TREATMENT  Women  Vaginal antifungal suppositories and creams.  Medicated creams to decrease irritation and itching on the outside of the vagina.  Warm compresses to the perineal area to decrease swelling and discomfort.  Oral antifungal medications.  Medicated vaginal suppositories or cream for repeated or recurrent infections.  Wash and dry the irritation areas before applying the cream.  Eating yogurt with lactobacillus may help with prevention and treatment.  Sometimes painting the vagina with gentian violet solution may help if creams and suppositories do not work. Men  Antifungal creams and oral antifungal medications.  Sometimes treatment must continue for 30 days after the symptoms go away to prevent recurrence. HOME CARE   INSTRUCTIONS  Women  Use cotton underwear and avoid tight-fitting clothing.  Avoid colored, scented toilet paper and deodorant tampons or pads.  Do not douche.  Keep your diabetes  under control.  Finish all the prescribed medications.  Keep your skin clean and dry.  Consume milk or yogurt with lactobacillus active culture regularly. If you get frequent yeast infections and think that is what the infection is, there are over-the-counter medications that you can get. If the infection does not show healing in 3 days, talk to your caregiver.  Tell your sex partner you have a yeast infection. Your partner may need treatment also, especially if your infection does not clear up or recurs. Men  Keep your skin clean and dry.  Keep your diabetes under control.  Finish all prescribed medications.  Tell your sex partner that you have a yeast infection so they can be treated if necessary. SEEK MEDICAL CARE IF:   Your symptoms do not clear up or worsen in one week after treatment.  You have an oral temperature above 102 F (38.9 C).  You have trouble swallowing or eating for a prolonged time.  You develop blisters on and around your vagina.  You develop vaginal bleeding and it is not your menstrual period.  You develop abdominal pain.  You develop intestinal problems as mentioned above.  You get weak or lightheaded.  You have painful or increased urination.  You have pain during sexual intercourse. MAKE SURE YOU:   Understand these instructions.  Will watch your condition.  Will get help right away if you are not doing well or get worse. Document Released: 04/03/2004 Document Revised: 05/19/2011 Document Reviewed: 07/16/2009 Exit[employer] Patient Information 2014 [employer].  

## 2013-01-13 ENCOUNTER — Telehealth: Payer: Self-pay

## 2013-01-13 ENCOUNTER — Other Ambulatory Visit: Payer: Self-pay

## 2013-01-13 LAB — HIV ANTIBODY (ROUTINE TESTING W REFLEX): HIV: NONREACTIVE

## 2013-01-13 LAB — COMPREHENSIVE METABOLIC PANEL WITH GFR
ALT: 12 U/L (ref 0–35)
AST: 15 U/L (ref 0–37)
Albumin: 4.4 g/dL (ref 3.5–5.2)
Alkaline Phosphatase: 46 U/L (ref 39–117)
BUN: 14 mg/dL (ref 6–23)
Calcium: 9.3 mg/dL (ref 8.4–10.5)
Chloride: 106 meq/L (ref 96–112)
Potassium: 4.6 meq/L (ref 3.5–5.3)
Sodium: 138 meq/L (ref 135–145)
Total Protein: 7.3 g/dL (ref 6.0–8.3)

## 2013-01-13 LAB — VITAMIN D 25 HYDROXY (VIT D DEFICIENCY, FRACTURES): Vit D, 25-Hydroxy: 20 ng/mL — ABNORMAL LOW (ref 30–89)

## 2013-01-13 LAB — LDL CHOLESTEROL, DIRECT: Direct LDL: 87 mg/dL

## 2013-01-13 LAB — COMPREHENSIVE METABOLIC PANEL
CO2: 24 mEq/L (ref 19–32)
Creat: 0.72 mg/dL (ref 0.50–1.10)
Glucose, Bld: 84 mg/dL (ref 70–99)
Total Bilirubin: 0.3 mg/dL (ref 0.3–1.2)

## 2013-01-13 LAB — TSH: TSH: 2.753 u[IU]/mL (ref 0.350–4.500)

## 2013-01-13 LAB — RPR

## 2013-01-13 NOTE — Telephone Encounter (Signed)
[facility] sent fax stating that Maxair is no longer on the market. They request a substitution/change to another Rx.

## 2013-01-14 LAB — URINE CULTURE: Colony Count: 6000

## 2013-01-14 LAB — HSV(HERPES SIMPLEX VRS) I + II AB-IGG
HSV 1 Glycoprotein G Ab, IgG: 0.21 IV
HSV 2 Glycoprotein G Ab, IgG: <0.1 IV

## 2013-01-14 LAB — GC/CHLAMYDIA PROBE AMP
CT Probe RNA: NEGATIVE
GC Probe RNA: NEGATIVE

## 2013-01-14 MED ORDER — ALBUTEROL SULFATE HFA 108 (90 BASE) MCG/ACT IN AERS: 2.0000 | INHALATION_SPRAY | Freq: Four times a day (QID) | RESPIRATORY_TRACT | Status: AC | PRN

## 2013-01-21 NOTE — Telephone Encounter (Signed)
Rx sent in 01/14/13.

## 2013-12-20 ENCOUNTER — Ambulatory Visit (INDEPENDENT_AMBULATORY_CARE_PROVIDER_SITE_OTHER): Payer: BC Managed Care – PPO | Admitting: Family Medicine

## 2013-12-20 VITALS — BP 100/62 | HR 62 | Temp 98.4°F | Resp 18 | Ht 63.5 in | Wt 154.0 lb

## 2013-12-20 DIAGNOSIS — J45909 Unspecified asthma, uncomplicated: Secondary | ICD-10-CM

## 2013-12-20 DIAGNOSIS — Z309 Encounter for contraceptive management, unspecified: Secondary | ICD-10-CM

## 2013-12-20 MED ORDER — ALBUTEROL SULFATE (5 MG/ML) 0.5% IN NEBU: 2.5000 mg | INHALATION_SOLUTION | Freq: Four times a day (QID) | RESPIRATORY_TRACT | Status: AC | PRN

## 2013-12-20 MED ORDER — LEVONORGESTREL-ETHINYL ESTRAD 0.1-20 MG-MCG PO TABS: 1.0000 | ORAL_TABLET | Freq: Every day | ORAL | Status: DC

## 2013-12-20 MED ORDER — LEVALBUTEROL TARTRATE 45 MCG/ACT IN AERO: 1.0000 | INHALATION_SPRAY | Freq: Four times a day (QID) | RESPIRATORY_TRACT | Status: DC | PRN

## 2013-12-20 MED ORDER — FLUTICASONE-SALMETEROL 250-50 MCG/DOSE IN AEPB: 1.0000 | INHALATION_SPRAY | Freq: Two times a day (BID) | RESPIRATORY_TRACT | Status: DC

## 2013-12-20 NOTE — Progress Notes (Signed)
Chief Complaint:  Chief Complaint  Patient presents with  . rx refills    they have stopped making maxair and bc pill, needs new neb machine     HPI: Sara Newton is a 33 y.o. female who is here for :  1. Asthma since childhood- triggers include weather changes, smoke, incense, perfume She has been training for a 1/2 marathon  And has only had 1 attack since started in June. Using prn , pirbuterol which has been the eonly thing that worked for her. She used to use albuterol inhaler MDI but nothing helped. She uses her nebs and that works better. Pulmonologist rx her 8 meds and she felt she did not need to take them all. One of them was Advair. She has not been using otc antihistamine. Dr Sara Newton?  Pulmonologist.   2. She had her LMP on 12/10/13. She is taking her OCP. She is not pregnant  Past Medical History  Diagnosis Date  . Allergy   . Asthma    History reviewed. No pertinent past surgical history. History   Social History  . Marital Status: Single    Spouse Name: N/A    Number of Children: N/A  . Years of Education: N/A   Occupational History  . Physics Professor    Social History Main Topics  . Smoking status: Never Smoker   . Smokeless tobacco: None  . Alcohol Use: Yes     Comment: socially  . Drug Use: No  . Sexual Activity: None   Other Topics Concern  . None   Social History Narrative  . None   Family History  Problem Relation Age of Onset  . Hypertension Mother   . Hypertension Father   . Dementia Paternal Grandfather   . Hypertension Sister    No Known Allergies Prior to Admission medications   Medication Sig Start Date End Date Taking? Authorizing Provider  albuterol (PROVENTIL) (5 MG/ML) 0.5% nebulizer solution Take 2.5 mg by nebulization every 6 (six) hours as needed for wheezing or shortness of breath.   Yes Historical Provider, MD  levonorgestrel-ethinyl estradiol (AVIANE,ALESSE,LESSINA) 0.1-20 MG-MCG tablet Take 1 tablet by mouth  daily. 01/12/13  Yes Sara Newton P Shaine Mount, DO  mometasone (NASONEX) 50 MCG/ACT nasal spray Place 2 sprays into the nose daily. 01/12/13  Yes Sara Newton P Justyna Timoney, DO  Multiple Vitamin (MULTIVITAMIN WITH MINERALS) TABS tablet Take 1 tablet by mouth daily.   Yes Historical Provider, MD  albuterol (PROVENTIL HFA;VENTOLIN HFA) 108 (90 BASE) MCG/ACT inhaler Inhale 2 puffs into the lungs every 6 (six) hours as needed for wheezing or shortness of breath. 01/14/13   Sara Newton P Forest Redwine, DO  fexofenadine (ALLEGRA) 180 MG tablet Take 180 mg by mouth daily as needed (for allergies).     Historical Provider, MD  pirbuterol (MAXAIR) 200 MCG/INH inhaler Inhale 2 puffs into the lungs 4 (four) times daily as needed for wheezing or shortness of breath. 01/12/13   Sara Newton P Sara Krogh, DO  thiamine 100 MG tablet Take 100 mg by mouth daily.    Historical Provider, MD  Vitamin D, Ergocalciferol, (DRISDOL) 50000 UNITS CAPS capsule Take 50,000 Units by mouth every 7 (seven) days. Taken on Wednesdays.    Historical Provider, MD     ROS: The patient denies fevers, chills, night sweats, unintentional weight loss, chest pain, palpitations, wheezing, dyspnea on exertion, nausea, vomiting, abdominal pain, dysuria, hematuria, melena, numbness, weakness, or tingling.   All other systems have been reviewed and were  otherwise negative with the exception of those mentioned in the HPI and as above.    PHYSICAL EXAM: Filed Vitals:   12/20/13 1735  BP: 100/62  Pulse: 62  Temp: 98.4 F (36.9 C)  Resp: 18   Filed Vitals:   12/20/13 1735  Height: 5' 3.5" (1.613 m)  Weight: 154 lb (69.854 kg)   Body mass index is 26.85 kg/(m^2).  General: Alert, no acute distress HEENT:  Normocephalic, atraumatic, oropharynx patent. EOMI, PERRLA Cardiovascular:  Regular rate and rhythm, no rubs murmurs or gallops.  No Carotid bruits, radial pulse intact. No pedal edema.  Respiratory: Clear to auscultation bilaterally.  No wheezes, rales, or rhonchi.  No cyanosis, no use of accessory  musculature GI: No organomegaly, abdomen is soft and non-tender, positive bowel sounds.  No masses. Skin: No rashes. Neurologic: Facial musculature symmetric. Psychiatric: Patient is appropriate throughout our interaction. Lymphatic: No cervical lymphadenopathy Musculoskeletal: Gait intact.   LABS:    EKG/XRAY:   Primary [facility]   ASSESSMENT/PLAN: Encounter Diagnoses  Name Primary?  Marland Kitchen. Asthma, chronic, unspecified asthma severity, uncomplicated Yes  . Encounter for contraceptive management, unspecified encounter    Refill OCP for 3 months until she gets her annual visit Rx Advair, Xopenex inhaler ( she ahs not been successful with albuterol inhaler in the past, has had to use perialbuterol  But no longer being manufactured, she has never tried xopenex in the past, also albuterol sulfate nebs to use at home.  Advise to take otc antihistamine F/u prn   Gross sideeffects, risk and benefits, and alternatives of medications d/w patient. Patient is aware that all medications have potential sideeffects and we are unable to predict every sideeffect or drug-drug interaction that may occur.  Sara Newton, Sara Newton PHUONG, DO 12/20/2013 6:58 PM

## 2014-10-21 ENCOUNTER — Emergency Department (HOSPITAL_COMMUNITY)
Admission: EM | Admit: 2014-10-21 | Discharge: 2014-10-21 | Disposition: A | Discharge status: Home / Self Care | Payer: Self-pay | Source: Home / Self Care

## 2015-05-15 LAB — OB RESULTS CONSOLE RUBELLA ANTIBODY, IGM: RUBELLA: IMMUNE

## 2015-05-15 LAB — OB RESULTS CONSOLE ABO/RH: RH Type: POSITIVE

## 2015-05-15 LAB — OB RESULTS CONSOLE HIV ANTIBODY (ROUTINE TESTING): HIV: NONREACTIVE

## 2015-05-15 LAB — OB RESULTS CONSOLE HEPATITIS B SURFACE ANTIGEN: HEP B S AG: NEGATIVE

## 2015-11-25 ENCOUNTER — Encounter (HOSPITAL_COMMUNITY): Payer: Self-pay | Admitting: *Deleted

## 2015-11-25 ENCOUNTER — Inpatient Hospital Stay (HOSPITAL_COMMUNITY): Payer: BC Managed Care – PPO | Admitting: Anesthesiology

## 2015-11-25 ENCOUNTER — Encounter (HOSPITAL_COMMUNITY)
Admission: AD | Disposition: A | Discharge status: Home / Self Care | Payer: Self-pay | Source: Ambulatory Visit | Attending: Obstetrics and Gynecology

## 2015-11-25 ENCOUNTER — Inpatient Hospital Stay (HOSPITAL_COMMUNITY)
Admission: AD | Admit: 2015-11-25 | Discharge: 2015-11-28 | DRG: 766 | Disposition: A | Discharge status: Home / Self Care | Payer: BC Managed Care – PPO | Source: Ambulatory Visit | Attending: Obstetrics and Gynecology | Admitting: Obstetrics and Gynecology

## 2015-11-25 DIAGNOSIS — Z3A40 40 weeks gestation of pregnancy: Secondary | ICD-10-CM

## 2015-11-25 DIAGNOSIS — Z3403 Encounter for supervision of normal first pregnancy, third trimester: Secondary | ICD-10-CM | POA: Diagnosis present

## 2015-11-25 DIAGNOSIS — IMO0001 Reserved for inherently not codable concepts without codable children: Secondary | ICD-10-CM

## 2015-11-25 DIAGNOSIS — Z98891 History of uterine scar from previous surgery: Secondary | ICD-10-CM

## 2015-11-25 HISTORY — DX: History of uterine scar from previous surgery: Z98.891

## 2015-11-25 LAB — CBC
HCT: 35.4 % — ABNORMAL LOW (ref 36.0–46.0)
Hemoglobin: 12.1 g/dL (ref 12.0–15.0)
MCH: 29.7 pg (ref 26.0–34.0)
MCHC: 34.2 g/dL (ref 30.0–36.0)
MCV: 87 fL (ref 78.0–100.0)
PLATELETS: 166 10*3/uL (ref 150–400)
RBC: 4.07 MIL/uL (ref 3.87–5.11)
RDW: 14.7 % (ref 11.5–15.5)
WBC: 14.3 10*3/uL — AB (ref 4.0–10.5)

## 2015-11-25 LAB — TYPE AND SCREEN
ABO/RH(D): O POS
Antibody Screen: NEGATIVE

## 2015-11-25 LAB — ABO/RH: ABO/RH(D): O POS

## 2015-11-25 LAB — SYPHILIS: RPR W/REFLEX TO RPR TITER AND TREPONEMAL ANTIBODIES, TRADITIONAL SCREENING AND DIAGNOSIS ALGORITHM: RPR Ser Ql: NONREACTIVE

## 2015-11-25 SURGERY — Surgical Case
Anesthesia: Epidural | Site: Abdomen

## 2015-11-25 MED ORDER — LACTATED RINGERS IV SOLN: 500.0000 mL | Freq: Once | INTRAVENOUS | Status: DC

## 2015-11-25 MED ORDER — ONDANSETRON HCL 4 MG/2ML IJ SOLN
4.0000 mg | Freq: Four times a day (QID) | INTRAMUSCULAR | Status: DC | PRN
  Administered 2015-11-25 (×2): 4 mg via INTRAVENOUS
  Filled 2015-11-25 (×2): qty 2

## 2015-11-25 MED ORDER — OXYCODONE-ACETAMINOPHEN 5-325 MG PO TABS: 2.0000 | ORAL_TABLET | ORAL | Status: DC | PRN

## 2015-11-25 MED ORDER — MAGNESIUM HYDROXIDE 400 MG/5ML PO SUSP: 30.0000 mL | ORAL | Status: DC | PRN

## 2015-11-25 MED ORDER — ALBUTEROL SULFATE (2.5 MG/3ML) 0.083% IN NEBU: 3.0000 mL | INHALATION_SOLUTION | Freq: Four times a day (QID) | RESPIRATORY_TRACT | Status: DC | PRN

## 2015-11-25 MED ORDER — PRENATAL MULTIVITAMIN CH
1.0000 | ORAL_TABLET | Freq: Every day | ORAL | Status: DC
  Administered 2015-11-26 – 2015-11-28 (×3): 1 via ORAL
  Filled 2015-11-25 (×3): qty 1

## 2015-11-25 MED ORDER — LEVALBUTEROL TARTRATE 45 MCG/ACT IN AERO: 1.0000 | INHALATION_SPRAY | Freq: Four times a day (QID) | RESPIRATORY_TRACT | Status: DC | PRN

## 2015-11-25 MED ORDER — MORPHINE SULFATE (PF) 0.5 MG/ML IJ SOLN
INTRAMUSCULAR | Status: DC | PRN
  Administered 2015-11-25: 4 mg via EPIDURAL
  Administered 2015-11-25: 1 mg via INTRAVENOUS

## 2015-11-25 MED ORDER — PHENYLEPHRINE 40 MCG/ML (10ML) SYRINGE FOR IV PUSH (FOR BLOOD PRESSURE SUPPORT)
80.0000 ug | PREFILLED_SYRINGE | INTRAVENOUS | Status: DC | PRN
  Filled 2015-11-25: qty 10

## 2015-11-25 MED ORDER — FLUTICASONE PROPIONATE 50 MCG/ACT NA SUSP
1.0000 | Freq: Every day | NASAL | Status: DC
  Administered 2015-11-27 – 2015-11-28 (×2): 1 via NASAL
  Filled 2015-11-25: qty 16

## 2015-11-25 MED ORDER — COCONUT OIL OIL
1.0000 "application " | TOPICAL_OIL | Status: DC | PRN
  Filled 2015-11-25: qty 120

## 2015-11-25 MED ORDER — CEFAZOLIN SODIUM-DEXTROSE 2-4 GM/100ML-% IV SOLN
INTRAVENOUS | Status: AC
  Filled 2015-11-25: qty 100

## 2015-11-25 MED ORDER — DIPHENHYDRAMINE HCL 50 MG/ML IJ SOLN: 12.5000 mg | INTRAMUSCULAR | Status: DC | PRN

## 2015-11-25 MED ORDER — PHENYLEPHRINE HCL 10 MG/ML IJ SOLN
INTRAMUSCULAR | Status: DC | PRN
  Administered 2015-11-25 (×2): 80 ug via INTRAVENOUS

## 2015-11-25 MED ORDER — FENTANYL 2.5 MCG/ML BUPIVACAINE 1/10 % EPIDURAL INFUSION (WH - ANES): 14.0000 mL/h | INTRAMUSCULAR | Status: DC | PRN

## 2015-11-25 MED ORDER — MEPERIDINE HCL 25 MG/ML IJ SOLN
INTRAMUSCULAR | Status: DC | PRN
Start: 2015-11-25 — End: 2015-11-25
  Administered 2015-11-25 (×2): 12.5 mg via INTRAVENOUS

## 2015-11-25 MED ORDER — ONDANSETRON HCL 4 MG/2ML IJ SOLN
INTRAMUSCULAR | Status: DC | PRN
  Administered 2015-11-25: 4 mg via INTRAVENOUS

## 2015-11-25 MED ORDER — LACTATED RINGERS IV SOLN
INTRAVENOUS | Status: DC | PRN
  Administered 2015-11-25: 19:00:00 via INTRAVENOUS

## 2015-11-25 MED ORDER — LACTATED RINGERS IV SOLN
INTRAVENOUS | Status: DC
  Administered 2015-11-26: 999 mL via INTRAVENOUS

## 2015-11-25 MED ORDER — PHENYLEPHRINE 40 MCG/ML (10ML) SYRINGE FOR IV PUSH (FOR BLOOD PRESSURE SUPPORT)
80.0000 ug | PREFILLED_SYRINGE | INTRAVENOUS | Status: DC | PRN
  Administered 2015-11-25: 80 ug via INTRAVENOUS

## 2015-11-25 MED ORDER — MOMETASONE FURO-FORMOTEROL FUM 200-5 MCG/ACT IN AERO
2.0000 | INHALATION_SPRAY | Freq: Two times a day (BID) | RESPIRATORY_TRACT | Status: DC
  Filled 2015-11-25: qty 8.8

## 2015-11-25 MED ORDER — CEFAZOLIN SODIUM-DEXTROSE 2-3 GM-% IV SOLR
INTRAVENOUS | Status: DC | PRN
  Administered 2015-11-25: 2 g via INTRAVENOUS

## 2015-11-25 MED ORDER — PHENYLEPHRINE 40 MCG/ML (10ML) SYRINGE FOR IV PUSH (FOR BLOOD PRESSURE SUPPORT): 80.0000 ug | PREFILLED_SYRINGE | INTRAVENOUS | Status: DC | PRN

## 2015-11-25 MED ORDER — EPHEDRINE 5 MG/ML INJ: 10.0000 mg | INTRAVENOUS | Status: DC | PRN

## 2015-11-25 MED ORDER — BUTORPHANOL TARTRATE 1 MG/ML IJ SOLN
1.0000 mg | INTRAMUSCULAR | Status: DC | PRN
  Administered 2015-11-25 (×2): 1 mg via INTRAVENOUS
  Filled 2015-11-25 (×2): qty 1

## 2015-11-25 MED ORDER — LACTATED RINGERS IV SOLN
500.0000 mL | INTRAVENOUS | Status: DC | PRN
  Administered 2015-11-25: 500 mL via INTRAVENOUS

## 2015-11-25 MED ORDER — LACTATED RINGERS IV SOLN
INTRAVENOUS | Status: DC
  Administered 2015-11-25 (×2): via INTRAVENOUS

## 2015-11-25 MED ORDER — DIBUCAINE 1 % RE OINT: 1.0000 "application " | TOPICAL_OINTMENT | RECTAL | Status: DC | PRN

## 2015-11-25 MED ORDER — MORPHINE SULFATE (PF) 0.5 MG/ML IJ SOLN
INTRAMUSCULAR | Status: AC
  Filled 2015-11-25: qty 10

## 2015-11-25 MED ORDER — TERBUTALINE SULFATE 1 MG/ML IJ SOLN
0.2500 mg | Freq: Once | INTRAMUSCULAR | Status: DC | PRN
  Filled 2015-11-25: qty 1

## 2015-11-25 MED ORDER — SODIUM CHLORIDE 0.9 % IR SOLN
Status: DC | PRN
  Administered 2015-11-25: 1

## 2015-11-25 MED ORDER — SOD CITRATE-CITRIC ACID 500-334 MG/5ML PO SOLN
30.0000 mL | ORAL | Status: DC | PRN
  Administered 2015-11-25 (×2): 30 mL via ORAL
  Filled 2015-11-25 (×2): qty 15

## 2015-11-25 MED ORDER — LIDOCAINE HCL (PF) 1 % IJ SOLN: 30.0000 mL | INTRAMUSCULAR | Status: DC | PRN

## 2015-11-25 MED ORDER — OXYTOCIN 40 UNITS IN LACTATED RINGERS INFUSION - SIMPLE MED
1.0000 m[IU]/min | INTRAVENOUS | Status: DC
  Administered 2015-11-25: 2 m[IU]/min via INTRAVENOUS

## 2015-11-25 MED ORDER — ACETAMINOPHEN 325 MG PO TABS: 650.0000 mg | ORAL_TABLET | ORAL | Status: DC | PRN

## 2015-11-25 MED ORDER — MEASLES, MUMPS & RUBELLA VAC ~~LOC~~ INJ
0.5000 mL | INJECTION | Freq: Once | SUBCUTANEOUS | Status: DC
  Filled 2015-11-25: qty 0.5

## 2015-11-25 MED ORDER — LIDOCAINE HCL (PF) 1 % IJ SOLN
INTRAMUSCULAR | Status: DC | PRN
  Administered 2015-11-25 (×2): 5 mL

## 2015-11-25 MED ORDER — OXYTOCIN 10 UNIT/ML IJ SOLN
INTRAVENOUS | Status: DC | PRN
  Administered 2015-11-25: 40 [IU] via INTRAVENOUS

## 2015-11-25 MED ORDER — MEPERIDINE HCL 25 MG/ML IJ SOLN
INTRAMUSCULAR | Status: AC
  Filled 2015-11-25: qty 1

## 2015-11-25 MED ORDER — ZOLPIDEM TARTRATE 5 MG PO TABS: 5.0000 mg | ORAL_TABLET | Freq: Every evening | ORAL | Status: DC | PRN

## 2015-11-25 MED ORDER — OXYCODONE HCL 5 MG PO TABS: 10.0000 mg | ORAL_TABLET | ORAL | Status: DC | PRN

## 2015-11-25 MED ORDER — TETANUS-DIPHTH-ACELL PERTUSSIS 5-2.5-18.5 LF-MCG/0.5 IM SUSP: 0.5000 mL | Freq: Once | INTRAMUSCULAR | Status: DC

## 2015-11-25 MED ORDER — WITCH HAZEL-GLYCERIN EX PADS: 1.0000 "application " | MEDICATED_PAD | CUTANEOUS | Status: DC | PRN

## 2015-11-25 MED ORDER — OXYTOCIN 10 UNIT/ML IJ SOLN
INTRAMUSCULAR | Status: AC
  Filled 2015-11-25: qty 4

## 2015-11-25 MED ORDER — SIMETHICONE 80 MG PO CHEW
80.0000 mg | CHEWABLE_TABLET | ORAL | Status: DC | PRN
  Administered 2015-11-26 – 2015-11-27 (×2): 80 mg via ORAL
  Filled 2015-11-25 (×2): qty 1

## 2015-11-25 MED ORDER — OXYTOCIN 40 UNITS IN LACTATED RINGERS INFUSION - SIMPLE MED
2.5000 [IU]/h | INTRAVENOUS | Status: DC
  Filled 2015-11-25: qty 1000

## 2015-11-25 MED ORDER — PHENYLEPHRINE 40 MCG/ML (10ML) SYRINGE FOR IV PUSH (FOR BLOOD PRESSURE SUPPORT)
PREFILLED_SYRINGE | INTRAVENOUS | Status: AC
Start: 2015-11-25 — End: 2015-11-25
  Filled 2015-11-25: qty 10

## 2015-11-25 MED ORDER — SENNOSIDES-DOCUSATE SODIUM 8.6-50 MG PO TABS
2.0000 | ORAL_TABLET | ORAL | Status: DC
  Administered 2015-11-27: 2 via ORAL
  Filled 2015-11-25: qty 2

## 2015-11-25 MED ORDER — IBUPROFEN 600 MG PO TABS
600.0000 mg | ORAL_TABLET | Freq: Four times a day (QID) | ORAL | Status: DC
  Administered 2015-11-26 – 2015-11-28 (×9): 600 mg via ORAL
  Filled 2015-11-25 (×8): qty 1

## 2015-11-25 MED ORDER — ONDANSETRON HCL 4 MG/2ML IJ SOLN
INTRAMUSCULAR | Status: AC
  Filled 2015-11-25: qty 2

## 2015-11-25 MED ORDER — OXYTOCIN 40 UNITS IN LACTATED RINGERS INFUSION - SIMPLE MED: 1.0000 m[IU]/min | INTRAVENOUS | Status: DC

## 2015-11-25 MED ORDER — OXYTOCIN BOLUS FROM INFUSION: 500.0000 mL | Freq: Once | INTRAVENOUS | Status: DC

## 2015-11-25 MED ORDER — OXYTOCIN 40 UNITS IN LACTATED RINGERS INFUSION - SIMPLE MED: 2.5000 [IU]/h | INTRAVENOUS | Status: AC

## 2015-11-25 MED ORDER — DIPHENHYDRAMINE HCL 25 MG PO CAPS: 25.0000 mg | ORAL_CAPSULE | Freq: Four times a day (QID) | ORAL | Status: DC | PRN

## 2015-11-25 MED ORDER — MENTHOL 3 MG MT LOZG: 1.0000 | LOZENGE | OROMUCOSAL | Status: DC | PRN

## 2015-11-25 MED ORDER — OXYCODONE HCL 5 MG PO TABS
5.0000 mg | ORAL_TABLET | ORAL | Status: DC | PRN
  Administered 2015-11-26 – 2015-11-27 (×3): 5 mg via ORAL
  Filled 2015-11-25 (×3): qty 1

## 2015-11-25 MED ORDER — SODIUM BICARBONATE 8.4 % IV SOLN
INTRAVENOUS | Status: DC | PRN
  Administered 2015-11-25 (×4): 5 mL via EPIDURAL

## 2015-11-25 MED ORDER — ALBUTEROL SULFATE HFA 108 (90 BASE) MCG/ACT IN AERS: 2.0000 | INHALATION_SPRAY | Freq: Four times a day (QID) | RESPIRATORY_TRACT | Status: DC | PRN

## 2015-11-25 MED ORDER — ACETAMINOPHEN 325 MG PO TABS
650.0000 mg | ORAL_TABLET | ORAL | Status: DC | PRN
  Administered 2015-11-27: 650 mg via ORAL
  Filled 2015-11-25: qty 2

## 2015-11-25 MED ORDER — FENTANYL 2.5 MCG/ML BUPIVACAINE 1/10 % EPIDURAL INFUSION (WH - ANES)
14.0000 mL/h | INTRAMUSCULAR | Status: DC | PRN
  Administered 2015-11-25 (×2): 14 mL/h via EPIDURAL
  Filled 2015-11-25 (×2): qty 125

## 2015-11-25 MED ORDER — OXYCODONE-ACETAMINOPHEN 5-325 MG PO TABS: 1.0000 | ORAL_TABLET | ORAL | Status: DC | PRN

## 2015-11-25 SURGICAL SUPPLY — 38 items
APL SKNCLS STERI-STRIP NONHPOA (GAUZE/BANDAGES/DRESSINGS) ×1
BENZOIN TINCTURE PRP APPL 2/3 (GAUZE/BANDAGES/DRESSINGS) ×2 IMPLANT
CHLORAPREP W/TINT 26ML (MISCELLANEOUS) ×3 IMPLANT
CLAMP CORD UMBIL (MISCELLANEOUS) IMPLANT
CLOSURE WOUND 1/2 X4 (GAUZE/BANDAGES/DRESSINGS) ×1
CLOTH BEACON ORANGE TIMEOUT ST (SAFETY) ×3 IMPLANT
CONTAINER PREFILL 10% NBF 15ML (MISCELLANEOUS) IMPLANT
DRSG OPSITE POSTOP 4X10 (GAUZE/BANDAGES/DRESSINGS) ×3 IMPLANT
ELECT REM PT RETURN 9FT ADLT (ELECTROSURGICAL) ×3
ELECTRODE REM PT RTRN 9FT ADLT (ELECTROSURGICAL) ×1 IMPLANT
EXTRACTOR VACUUM KIWI (MISCELLANEOUS) IMPLANT
EXTRACTOR VACUUM M CUP 4 TUBE (SUCTIONS) IMPLANT
EXTRACTOR VACUUM M CUP 4' TUBE (SUCTIONS)
GLOVE BIOGEL PI IND STRL 7.0 (GLOVE) ×1 IMPLANT
GLOVE BIOGEL PI INDICATOR 7.0 (GLOVE) ×4
GLOVE ORTHO TXT STRL SZ7.5 (GLOVE) ×3 IMPLANT
GLOVE SURG SS PI 7.0 STRL IVOR (GLOVE) ×2 IMPLANT
GOWN STRL REUS W/TWL LRG LVL3 (GOWN DISPOSABLE) ×8 IMPLANT
KIT ABG SYR 3ML LUER SLIP (SYRINGE) IMPLANT
NDL HYPO 25X5/8 SAFETYGLIDE (NEEDLE) ×1 IMPLANT
NEEDLE HYPO 25X5/8 SAFETYGLIDE (NEEDLE) ×3 IMPLANT
NS IRRIG 1000ML POUR BTL (IV SOLUTION) ×3 IMPLANT
PACK C SECTION WH (CUSTOM PROCEDURE TRAY) ×3 IMPLANT
PAD OB MATERNITY 4.3X12.25 (PERSONAL CARE ITEMS) ×3 IMPLANT
PENCIL SMOKE EVAC W/HOLSTER (ELECTROSURGICAL) ×3 IMPLANT
RTRCTR C-SECT PINK 25CM LRG (MISCELLANEOUS) ×3 IMPLANT
STRIP CLOSURE SKIN 1/2X4 (GAUZE/BANDAGES/DRESSINGS) ×1 IMPLANT
SUT CHROMIC 1 CTX 36 (SUTURE) ×6 IMPLANT
SUT PLAIN 0 NONE (SUTURE) IMPLANT
SUT PLAIN 2 0 XLH (SUTURE) ×2 IMPLANT
SUT VIC AB 0 CT1 27 (SUTURE) ×6
SUT VIC AB 0 CT1 27XBRD ANBCTR (SUTURE) ×2 IMPLANT
SUT VIC AB 2-0 CT1 (SUTURE) ×3 IMPLANT
SUT VIC AB 2-0 CT1 27 (SUTURE) ×3
SUT VIC AB 2-0 CT1 TAPERPNT 27 (SUTURE) ×1 IMPLANT
SUT VIC AB 4-0 KS 27 (SUTURE) ×2 IMPLANT
TOWEL OR 17X24 6PK STRL BLUE (TOWEL DISPOSABLE) ×3 IMPLANT
TRAY FOLEY CATH SILVER 14FR (SET/KITS/TRAYS/PACK) ×1 IMPLANT

## 2015-11-25 NOTE — Anesthesia Preprocedure Evaluation (Signed)
Anesthesia Evaluation  Patient identified by MRN, date of birth, ID band Patient awake    Reviewed: Allergy & Precautions, NPO status , Patient's Chart, lab work & pertinent test results  Airway Mallampati: II  TM Distance: >3 FB Neck ROM: Full    Dental no notable dental hx.    Pulmonary asthma ,    Pulmonary exam normal        Cardiovascular negative cardio ROS Normal cardiovascular exam     Neuro/Psych negative neurological ROS  negative psych ROS   GI/Hepatic negative GI ROS, Neg liver ROS,   Endo/Other  negative endocrine ROS  Renal/GU negative Renal ROS  negative genitourinary   Musculoskeletal negative musculoskeletal ROS (+)   Abdominal   Peds negative pediatric ROS (+)  Hematology negative hematology ROS (+)   Anesthesia Other Findings   Reproductive/Obstetrics (+) Pregnancy                             Anesthesia Physical Anesthesia Plan  ASA: II  Anesthesia Plan: Epidural   Post-op Pain Management:    Induction:   Airway Management Planned:   Additional Equipment:   Intra-op Plan:   Post-operative Plan:   Informed Consent:   Plan Discussed with:   Anesthesia Plan Comments:         Anesthesia Quick Evaluation

## 2015-11-25 NOTE — Anesthesia Pain Management Evaluation Note (Signed)
  [facility] Pain Management Visit Note  Patient: Sara Newton, 35 y.o., female  "Hello [facility] am a [facility]. We have an anesthesia team available at all times to provide care throughout the hospital, including epidural management and anesthesia for C-section. I don't know your plan for the delivery whether it a natural birth, water birth, IV sedation, nitrous supplementation, doula or epidural, but we want to meet your pain goals."   1.Was your pain managed to your expectations on prior hospitalizations?   No prior hospitalizations  2.What is your expectation for pain management during this hospitalization?     Epidural  3.How can we help you reach that goal? epidural  Record the patient's initial score and the patient's pain goal.   Pain: 0  Pain Goal: 4 [facility] wants you to be able to say your pain was always managed very well.  Makaveli Hoard 11/25/2015

## 2015-11-25 NOTE — Consult Note (Signed)
Neonatology Note:   Attendance at C-section:    I was asked by Dr. [facility]MEISINGER to attend this C/S at term for FTP with decels. The mother is a G1, GBS neg with good prenatal care. ROM 10 hours before delivery, fluid moderate mec. Infant vigorous with good spontaneous cry and tone. Needed only minimal bulb suctioning. Ap 8/9. Lungs clear to ausc in DR. To CN to care of Pediatrician.  Dineen Kidavid C. Leary RocaEhrmann, MD

## 2015-11-25 NOTE — Anesthesia Procedure Notes (Signed)
Epidural Patient location during procedure: OB Start time: 11/25/2015 8:13 AM End time: 11/25/2015 8:20 AM  Staffing Anesthesiologist: Bonita QuinGUIDETTI, Shaylene Paganelli S Performed: anesthesiologist   Preanesthetic Checklist Completed: patient identified, site marked, surgical consent, pre-op evaluation, timeout performed, IV checked, risks and benefits discussed and monitors and equipment checked  Epidural Patient position: sitting Prep: site prepped and draped and DuraPrep Patient monitoring: continuous pulse ox and blood pressure Approach: midline Location: L4-L5 Injection technique: LOR air  Needle:  Needle type: Tuohy  Needle gauge: 17 G Needle length: 9 cm and 9 Needle insertion depth: 5 cm cm Catheter type: closed end flexible Catheter size: 19 Gauge Catheter at skin depth: 10 cm Test dose: negative  Assessment Events: blood not aspirated, injection not painful, no injection resistance, negative IV test and no paresthesia

## 2015-11-25 NOTE — Progress Notes (Signed)
Dr Jackelyn Knifemeisinger returned call - order to watch strip for next 30 min and restart pitocin at 581ml/min

## 2015-11-25 NOTE — Progress Notes (Signed)
Dr Jackelyn Knifemeisinger on phone- aware of prolonged decels and loss of variability - and that pitocin was d/c'd earlier -

## 2015-11-25 NOTE — Anesthesia Postprocedure Evaluation (Signed)
Anesthesia Post Note  Patient: Sara Newton  Procedure(s) Performed: Procedure(s) (LRB): CESAREAN SECTION (N/A)  Patient location during evaluation: Mother Baby Anesthesia Type: Epidural Level of consciousness: awake and alert Pain management: pain level controlled Vital Signs Assessment: post-procedure vital signs reviewed and stable Respiratory status: spontaneous breathing, nonlabored ventilation and respiratory function stable Cardiovascular status: stable Postop Assessment: no headache, no backache and epidural receding Anesthetic complications: no     Last Vitals:  Vitals:   11/25/15 1947 11/25/15 2000  BP:  (!) 100/59  Pulse: 89 92  Resp: (!) 24 17  Temp:  37.3 C    Last Pain:  Vitals:   11/25/15 1801  TempSrc: Oral  PainSc:    Pain Goal: Patients Stated Pain Goal: 4 (11/25/15 0750)               Bonita Quinichard S Johm Pfannenstiel

## 2015-11-25 NOTE — Op Note (Signed)
Preoperative diagnosis: Intrauterine pregnancy at 40 weeks, recurrent FHR decels, protracted labor Postoperative diagnosis: Same Procedure: Primary low transverse cesarean section without extensions Surgeon: Lavina Hammanodd Patric Vanpelt M.D. Anesthesia: Epidural Findings: Patient had normal gravid anatomy and delivered a viable female infant with Apgars of 9 and 9 weight pending Estimated blood loss: 800 cc Specimens: Placenta sent for routine pathology Complications: None  Procedure in detail: The patient was taken to the operating room and placed in the dorsosupine position. Her previously placed epidural was dosed appropriately.  Abdomen was then prepped and draped in the usual sterile fashion, a foley catheter had been previously inserted. The level of her anesthesia was found to be adequate. Abdomen was entered via a standard Pfannenstiel incision. Once the peritoneal cavity was entered the Alexis disposable self-retaining retractor was placed and good visualization was achieved. A 4 cm transverse incision was then made in the lower uterine segment pushing the bladder inferior. Once the uterine cavity was entered the incision was extended digitally. The fetal vertex was grasped and delivered through the incision atraumatically. Mouth and nares were suctioned. The remainder of the infant then delivered atraumatically. Cord was doubly clamped and cut and the infant handed to the awaiting pediatric team. Cord blood was obtained. The placenta delivered spontaneously. Uterus was wiped dry with clean lap pad and all clots and debris were removed. Uterine incision was inspected and found to be free of extensions. Uterine incision was closed in 2 layers with running locking #1 Chromic for the first layer, an imbricating layer with #1 Chromic for the 2nd layer. Tubes and ovaries were inspected and found to be normal. Uterine incision was inspected and found to be hemostatic. Bleeding from serosal edges was controlled with  electrocautery. The Alexis retractor was removed. Subfascial space was irrigated and made hemostatic with electrocautery. Peritoneum was closed with 2-0 Vicryl.  Fascia was closed in running fashion starting at both ends and meeting in the middle with 0 Vicryl. Subcutaneous tissue was then irrigated and made hemostatic with electrocautery, then closed with running 2-0 plain gut. Skin was closed with running 4-0 Vicryl subcuticular suture followed by steri-strips and a sterile dressing. Patient tolerated the procedure well and was taken to the recovery in stable condition. Counts were correct x2, she received Ancef 2 g IV at the beginning of the procedure and she had PAS hose on throughout the procedure.

## 2015-11-25 NOTE — Progress Notes (Signed)
FHR with another prolonged decel, pitocin turned off Discussed c-section procedure and risks, will proceed when the OR is ready

## 2015-11-25 NOTE — Transfer of Care (Signed)
Immediate Anesthesia Transfer of Care Note  Patient: Sara Newton  Procedure(s) Performed: Procedure(s): CESAREAN SECTION (N/A)  Patient Location: PACU  Anesthesia Type:Epidural  Level of Consciousness: awake, alert  and oriented  Airway & Oxygen Therapy: Patient Spontanous Breathing  Post-op Assessment: Report given to RN and Post -op Vital signs reviewed and stable  Post vital signs: Reviewed and stable  Last Vitals:  Vitals:   11/25/15 1658 11/25/15 1801  BP:    Pulse:    Resp:  18  Temp: 36.9 C 37.7 C    Last Pain:  Vitals:   11/25/15 1801  TempSrc: Oral  PainSc:       Patients Stated Pain Goal: 4 (11/25/15 0750)  Complications: No apparent anesthesia complications

## 2015-11-25 NOTE — Progress Notes (Signed)
Comfortable with epidural Afeb, VSS FHT- 130s, mod variability, may have had a few late decels, + accel and + scalp stim,  Cat II, ctx q 6 min VE-5/80/0 Will augment with pitocin, monitor progress and FHT

## 2015-11-25 NOTE — H&P (Signed)
Sara Newton is a 35 y.o. female, G1P0, EGA [redacted] weeks with EDC 9-16 presenting for ctx.  On eval in MAU VE 4 cm dilated with ctx q 6 min.  Pt admitted and has received an epidural.  Prenatal care essentially uncomplicated.  OB History    Gravida Para Term Preterm AB Living   1             SAB TAB Ectopic Multiple Live Births                 Past Medical History:  Diagnosis Date  . Allergy   . Asthma    Past Surgical History:  Procedure Laterality Date  . NO PAST SURGERIES     Family History: family history includes Dementia in her paternal grandfather; Hypertension in her father, mother, and sister. Social History:  reports that she has never smoked. She has never used smokeless tobacco. She reports that she drinks alcohol. She reports that she does not use drugs.     Maternal Diabetes: No Genetic Screening: Declined Maternal Ultrasounds/Referrals: Normal Fetal Ultrasounds or other Referrals:  None Maternal Substance Abuse:  No Significant Maternal Medications:  None Significant Maternal Lab Results:  Lab values include: Group B Strep negative Other Comments:  None  Review of Systems  Respiratory: Negative.   Cardiovascular: Negative.    Maternal Medical History:  Reason for admission: Contractions.   Contractions: Frequency: regular.   Perceived severity is moderate.    Fetal activity: Perceived fetal activity is normal.    Prenatal complications: no prenatal complications  AROM-mod mec Dilation: 5 Effacement (%): 80 Station: 0 Exam by:: dr Sara Newton  Blood pressure 118/63, pulse 86, temperature 97.8 F (36.6 C), temperature source Oral, resp. rate 18, height 5\' 4"  (1.626 m), weight 91.2 kg (201 lb), SpO2 99 %. Maternal Exam:  Uterine Assessment: Contraction strength is moderate.  Contraction frequency is regular.   Abdomen: Patient reports no abdominal tenderness. Estimated fetal weight is 8 lbs.   Fetal presentation: vertex  Introitus: Normal vulva.  Normal vagina.  Amniotic fluid character: meconium stained.  Pelvis: adequate for delivery.   Cervix: Cervix evaluated by digital exam.     Fetal Exam Fetal Monitor Review: Mode: ultrasound.   Baseline rate: 130.  Variability: moderate (6-25 bpm).   Pattern: accelerations present and no decelerations.    Fetal State Assessment: Category I - tracings are normal.     Physical Exam  Vitals reviewed. Constitutional: She appears well-developed and well-nourished.  Respiratory: Effort normal. No respiratory distress.  GI: Soft.    Prenatal labs: ABO, Rh: --/--/O POS (09/17 [id number]) Antibody: NEG (09/17 0415) Rubella:  Immune RPR:   NR HBsAg:   Neg HIV:   NR GBS:   Neg  Assessment/Plan: IUP at 40 weeks in active labor, moderate meconium.  Monitor progress, augment as needed, anticipate SVD.   Sara Newton D 11/25/2015, 9:09 AM

## 2015-11-25 NOTE — Progress Notes (Signed)
Comfortable with epidural Afeb, VSS FHT- 140-150, mod variability, + accels, non-recurrent late decels, Cat II, ctx q 2-4 min on 2 mu/min pitocin VE-6-7/80/-1, vtx Discussed that she has made minimal progress since my exam this morning, and essentially no progress over the past 6 hours or so.  FHT currently ok.  Gave her the option of c-section now or watch for another 2 hours, she wants to give it more time.  Advised if FHT has more decels I would recommend stopping pitocin and doing c-section.

## 2015-11-26 ENCOUNTER — Encounter (HOSPITAL_COMMUNITY): Payer: Self-pay | Admitting: Obstetrics and Gynecology

## 2015-11-26 LAB — CBC
HEMATOCRIT: 32 % — AB (ref 36.0–46.0)
HEMOGLOBIN: 11 g/dL — AB (ref 12.0–15.0)
MCH: 30 pg (ref 26.0–34.0)
MCHC: 34.4 g/dL (ref 30.0–36.0)
MCV: 87.2 fL (ref 78.0–100.0)
PLATELETS: 144 10*3/uL — AB (ref 150–400)
RBC: 3.67 MIL/uL — AB (ref 3.87–5.11)
RDW: 14.6 % (ref 11.5–15.5)
WBC: 21 10*3/uL — AB (ref 4.0–10.5)

## 2015-11-26 NOTE — Lactation Note (Addendum)
This note was copied from a baby's chart. Lactation Consultation Note New mom has everted nipples large nipples. Nipple feels thick when compressed causing nipple to semi flatten. Baby has wide flange taught hand expression. Took several minutes before glistening of colostrum came to surface. Assisted mom in latching in football position. Mom states baby has latched well in previous feedings. Encouraged to roll nipple between her fingers to evert well. Baby sleepy and not interested in BF. When nipple compressed and tried to latch baby, baby not interested. Gave mom shells to evert nipples to assist in decreasing edema to LC. Gave mom hand pump to evert nipple as well. Mom encouraged to feed baby 8-12 times/24 hours and with feeding cues. Mom encouraged to feed baby 8-12 times/24 hours and with feeding cues. Referred to Baby and Me Book in Breastfeeding section Pg. 22-23 for position options and Proper latch demonstration. Educated about newborn behavior, STS, I&O, cluster feeding, supply and demand. WH/LC brochure given w/resources, support groups and LC services. Patient Name: Sara Newton Reason for consult: Initial assessment   Maternal Data Has patient been taught Hand Expression?: Yes Does the patient have breastfeeding experience prior to this delivery?: No  Feeding Feeding Type: Breast Fed Length of feed: 0 min  LATCH Score/Interventions Latch: Too sleepy or reluctant, no latch achieved, no sucking elicited. Intervention(s): Skin to skin;Teach feeding cues;Waking techniques Intervention(s): Adjust position;Assist with latch;Breast massage;Breast compression  Audible Swallowing: None Intervention(s): Skin to skin;Hand expression  Type of Nipple: Everted at rest and after stimulation  Comfort (Breast/Nipple): Soft / non-tender     Hold (Positioning): Full assist, staff holds infant at breast Intervention(s): Breastfeeding basics reviewed;Support  Pillows;Position options;Skin to skin  LATCH Score: 4  Lactation Tools Discussed/Used Tools: Shells;Pump Shell Type: Inverted Breast pump type: Manual WIC Program: No Pump Review: Setup, frequency, and cleaning;Milk Storage Initiated by:: Peri JeffersonL. Finnlee Silvernail RN IBCLC Date initiated:: 11/26/15   Consult Status Consult Status: Follow-up Date: 11/26/15 Follow-up type: In-patient    Sara DancerCARVER, Sara Newton Newton, 6:43 AM

## 2015-11-26 NOTE — Addendum Note (Signed)
Addendum  created 11/26/15 0739 by Elgie CongoNataliya H Kazuo Durnil, CRNA   Sign clinical note

## 2015-11-26 NOTE — Anesthesia Postprocedure Evaluation (Signed)
Anesthesia Post Note  Patient: Sara Newton  Procedure(s) Performed: Procedure(s) (LRB): CESAREAN SECTION (N/A)  Patient location during evaluation: Mother Baby Anesthesia Type: Epidural Level of consciousness: awake and alert Pain management: pain level controlled Vital Signs Assessment: post-procedure vital signs reviewed and stable Respiratory status: spontaneous breathing and nonlabored ventilation Cardiovascular status: stable Postop Assessment: no headache, no backache, patient able to bend at knees, epidural receding, no signs of nausea or vomiting and adequate PO intake Anesthetic complications: no     Last Vitals:  Vitals:   11/26/15 0105 11/26/15 0500  BP: 102/60 118/63  Pulse: 87 90  Resp: 18 18  Temp: 37.2 C 37.2 C    Last Pain:  Vitals:   11/26/15 0510  TempSrc:   PainSc: 5    Pain Goal: Patients Stated Pain Goal: 4 (11/25/15 0750)               [name],[name]

## 2015-11-26 NOTE — Lactation Note (Signed)
This note was copied from a baby's chart. Lactation Consultation Note  Baby has been sleepy today.  Encouraged STS and taught parents how to spoon feed baby drops. Assisted w/ latching baby in football hold.  Rhythmical sucks and swallows observed. Suggest if parents have difficulty latching give baby drops on spoon and prepump w/ manual pump before latching to evert nipple. Discussed massage/compressing breast to keep baby active and cluster feeding. Parents receptive to teaching.   Patient Name: Sara Newton ZOXWR'UToday's Date: 11/26/2015 Reason for consult: Follow-up assessment   Maternal Data    Feeding Feeding Type: Breast Fed  LATCH Score/Interventions Latch: Grasps breast easily, tongue down, lips flanged, rhythmical sucking. Intervention(s): Skin to skin;Teach feeding cues;Waking techniques Intervention(s): Adjust position;Assist with latch;Breast massage;Breast compression  Audible Swallowing: A few with stimulation Intervention(s): Skin to skin;Hand expression  Type of Nipple: Everted at rest and after stimulation  Comfort (Breast/Nipple): Soft / non-tender     Hold (Positioning): Assistance needed to correctly position infant at breast and maintain latch.  LATCH Score: 8  Lactation Tools Discussed/Used     Consult Status Consult Status: Follow-up Date: 11/27/15 Follow-up type: In-patient    Dahlia ByesBerkelhammer, Valerian Jewel [facility] 11/26/2015, 2:09 PM

## 2015-11-26 NOTE — Progress Notes (Signed)
Subjective: Postpartum Day 1: Cesarean Delivery Patient reports incisional pain, tolerating PO, + flatus and no problems voiding.  Bonding well with baby.   Objective: Vital signs in last 24 hours: Temp:  [97.9 F (36.6 C)-100.2 F (37.9 C)] 99 F (37.2 C) (09/18 0500) Pulse Rate:  [74-95] 90 (09/18 0500) Resp:  [15-27] 18 (09/18 0500) BP: (96-122)/(52-75) 118/63 (09/18 0500) SpO2:  [93 %-100 %] 95 % (09/18 0500)  Physical Exam:  General: alert, cooperative and no distress Lochia: appropriate Uterine Fundus: firm Incision: dressing c/d/i DVT Evaluation: No evidence of DVT seen on physical exam. No significant calf/ankle edema.   Recent Labs  11/25/15 0415 11/26/15 0506  HGB 12.1 11.0*  HCT 35.4* 32.0*    Assessment/Plan: Status post Cesarean section. Doing well postoperatively.  Routine pp/post op care Circumcision later tonight for baby at 24hrs  Sara Newton 11/26/2015, 9:52 AM

## 2015-11-27 NOTE — Progress Notes (Signed)
Subjective: Postpartum Day 2 Cesarean Delivery Patient reports incisional pain, tolerating PO and no problems voiding.    Objective: Vital signs in last 24 hours: Temp:  [98 F (36.7 C)-98.3 F (36.8 C)] 98 F (36.7 C) (09/19 0615) Pulse Rate:  [72-83] 74 (09/19 0615) Resp:  [16-20] 18 (09/19 0615) BP: (104-116)/(53-61) 105/61 (09/19 0615) SpO2:  [97 %] 97 % (09/18 0930)  Physical Exam:  General: alert and cooperative Lochia: appropriate Uterine Fundus: firm Incision: C/D/I  Small amount dried blood on dressing    Recent Labs  11/25/15 0415 11/26/15 0506  HGB 12.1 11.0*  HCT 35.4* 32.0*    Assessment/Plan: Status post Cesarean section. Doing well postoperatively. Would like to stay one more day as is only 36 hours post c-section Continue current care.  Oliver PilaICHARDSON,Reford Olliff W 11/27/2015, 9:01 AM

## 2015-11-27 NOTE — Anesthesia Postprocedure Evaluation (Signed)
Anesthesia Post Note  Patient: Sara Newton  Procedure(s) Performed: Procedure(s) (LRB): CESAREAN SECTION (N/A)  Patient location during evaluation: Mother Baby Anesthesia Type: Epidural Level of consciousness: awake Pain management: pain level controlled Vital Signs Assessment: post-procedure vital signs reviewed and stable Respiratory status: spontaneous breathing Cardiovascular status: stable Postop Assessment: no headache, no backache, epidural receding, patient able to bend at knees, no signs of nausea or vomiting and adequate PO intake Anesthetic complications: no     Last Vitals:  Vitals:   11/26/15 1630 11/27/15 0615  BP: (!) 113/53 105/61  Pulse: 72 74  Resp: 17 18  Temp: 36.7 C 36.7 C    Last Pain:  Vitals:   11/27/15 0615  TempSrc: Oral  PainSc:    Pain Goal: Patients Stated Pain Goal: 4 (11/26/15 2156)               Fanny DanceMULLINS,Yolinda Duerr

## 2015-11-27 NOTE — Addendum Note (Signed)
Addendum  created 11/27/15 0750 by Renford DillsJanet L Lauri Purdum, CRNA   Sign clinical note

## 2015-11-28 ENCOUNTER — Encounter (HOSPITAL_COMMUNITY): Payer: Self-pay | Admitting: Obstetrics and Gynecology

## 2015-11-28 DIAGNOSIS — Z98891 History of uterine scar from previous surgery: Secondary | ICD-10-CM

## 2015-11-28 HISTORY — DX: History of uterine scar from previous surgery: Z98.891

## 2015-11-28 MED ORDER — PRENATAL 27-0.8 MG PO TABS: 1.0000 | ORAL_TABLET | Freq: Every day | ORAL | 1 refills | Status: AC

## 2015-11-28 MED ORDER — INFLUENZA VAC SPLIT QUAD 0.5 ML IM SUSY
0.5000 mL | PREFILLED_SYRINGE | INTRAMUSCULAR | Status: AC
  Administered 2015-11-28: 0.5 mL via INTRAMUSCULAR
  Filled 2015-11-28: qty 0.5

## 2015-11-28 MED ORDER — OXYCODONE HCL 5 MG PO TABS: ORAL_TABLET | ORAL | 0 refills | Status: DC

## 2015-11-28 MED ORDER — IBUPROFEN 800 MG PO TABS: 800.0000 mg | ORAL_TABLET | Freq: Three times a day (TID) | ORAL | 1 refills | Status: AC | PRN

## 2015-11-28 NOTE — Progress Notes (Signed)
Subjective: Postpartum Day 3: Cesarean Delivery Patient reports incisional pain, tolerating PO and no problems voiding.    Objective: Vital signs in last 24 hours: Temp:  [98.5 F (36.9 C)-98.7 F (37.1 C)] 98.5 F (36.9 C) (09/20 0548) Pulse Rate:  [82-86] 82 (09/20 0548) Resp:  [18] 18 (09/20 0548) BP: (110-120)/(64-76) 120/76 (09/20 0548)  Physical Exam:  General: alert and no distress Lochia: appropriate Uterine Fundus: firm Incision: healing well DVT Evaluation: No evidence of DVT seen on physical exam.   Recent Labs  11/26/15 0506  HGB 11.0*  HCT 32.0*    Assessment/Plan: Status post Cesarean section. Doing well postoperatively.  Discharge home with standard precautions and return to clinic in 2 weeks and 6 weeks.  D/C with Motrin and percocet.    Bovard-Stuckert, Genieve Ramaswamy 11/28/2015, 8:39 AM

## 2015-11-28 NOTE — Lactation Note (Signed)
This note was copied from a baby's chart. Lactation Consultation Note  Patient Name: Sara Ziomara Birt ZOXWR'UToday's Date: 11/28/2015 Reason foJonathon Jordanr consult: Follow-up assessment;Infant weight loss;Breast/nipple pain (8% weight loss )  Baby is 2364 hours old and has been consistent at the breast/ 10 - 20 mins, Voids and stools QS ,  Bili 52 hours - 7.1. Voids and stools QS. Per mom having some nipple soreness. LC instructed mom breast  Massage, hand express, reverse pressure , firm support, not allowing the baby to nibble onto the breast.  Tickle upper lip , wait for the wide open mouth, and latch with breast compressions until comfort achieved.  Also important to wear the shells between feedings except when sleeping until the swelling improves and the  Discomfort improves . No breakdown noted of nipples, just swelling. LC encouraged mom to use her EBM  To both nipples liberally. Sore nipple and engorgement reviewed and tx. Mom has a hand pump , and a DEBP at Home. #27 Flanges given to mom x2 in case she needs them when her milk comes in .  LC recommended due to the 8% weight loss when the baby  isn't cluster feeding to add post pumping both breast for  10 - 15 mins to enhance milk coming in.  Mom requested and LC O/P appt. [facility]30 [facility] . Appt. Reminder given to mom.  Mother informed of post-discharge support and given phone number to the lactation department, including services for phone  call assistance; out-patient appointments; and breastfeeding support group. List of other breastfeeding resources in the community  given in the handout. Encouraged mother to call for problems or concerns related to breastfeeding.    Maternal Data Has patient been taught Hand Expression?: Yes Does the patient have breastfeeding experience prior to this delivery?: No  Feeding Feeding Type: Breast Fed Length of feed: 25 min (multiply swallows , increased compresssions  )  LATCH Score/Interventions Latch: Grasps breast easily, tongue down, lips flanged, rhythmical sucking. Intervention(s): Skin to skin Intervention(s): Adjust position;Assist with latch;Breast compression  Audible Swallowing: Spontaneous and intermittent Intervention(s): Hand expression;Skin to skin  Type of Nipple: Everted at rest and after stimulation (Semi-compressible nipples:Swelling)  Comfort (Breast/Nipple): Filling, red/small blisters or bruises, mild/mod discomfort  Problem noted: Mild/Moderate discomfort  Hold (Positioning): Assistance needed to correctly position infant at breast and maintain latch. Intervention(s): Support Pillows;Position options;Skin to skin;Breastfeeding basics reviewed  LATCH Score: 8  Lactation Tools Discussed/Used Tools: Shells Shell Type: Inverted WIC Program: No   Consult Status Consult Status: Follow-up Date: 11/28/15 Follow-up type: In-patient    Kathrin Greathouseorio, Hermen Mario Ann 11/28/2015, 11:46 AM

## 2015-11-28 NOTE — Discharge Summary (Signed)
OB Discharge Summary     Patient Name: Sara Newton DOB: Jan 04, 1981 MRN: [id number]  Date of admission: 11/25/2015 Delivering MD: Jackelyn Knife, TODD   Date of discharge: 11/28/2015  Admitting diagnosis: IN LABOR Intrauterine pregnancy: [redacted]w[redacted]d     Secondary diagnosis:  Principal Problem:   S/P cesarean section Active Problems:   Active labor at term  Additional problems: protracted labor, repetitive late decels     Discharge diagnosis: Term Pregnancy Delivered                                                                                                Post partum procedures:none  Augmentation: Pitocin  Complications: None  Hospital course:  Onset of Labor With Unplanned C/S  35 y.o. yo G1P1001 at [redacted]w[redacted]d was admitted in Active Labor on 11/25/2015. Patient had a labor course significant for repetitve decels and protracted course. Membrane Rupture Time/Date: 9:05 AM ,11/25/2015   The patient went for cesarean section due to Non-Reassuring FHR, and delivered a Viable infant,11/25/2015  Details of operation can be found in separate operative note. Patient had an uncomplicated postpartum course.  She is ambulating,tolerating a regular diet, passing flatus, and urinating well.  Patient is discharged home in stable condition 11/28/15.   Physical exam Vitals:   11/26/15 1630 11/27/15 0615 11/27/15 1816 11/28/15 0548  BP: (!) 113/53 105/61 110/64 120/76  Pulse: 72 74 86 82  Resp: 17 18 18 18   Temp: 98.1 F (36.7 C) 98 F (36.7 C) 98.7 F (37.1 C) 98.5 F (36.9 C)  TempSrc: Oral Oral  Oral  SpO2:      Weight:      Height:       General: alert and no distress Lochia: appropriate Uterine Fundus: firm Incision: Healing well with no significant drainage DVT Evaluation: No evidence of DVT seen on physical exam. Labs: Lab Results  Component Value Date   WBC 21.0 (H) 11/26/2015   HGB 11.0 (L) 11/26/2015   HCT 32.0 (L) 11/26/2015   MCV 87.2 11/26/2015   PLT 144 (L) 11/26/2015    CMP Latest Ref Rng & Units 01/12/2013  Glucose 70 - 99 mg/dL 84  BUN 6 - 23 mg/dL 14  Creatinine 9.56 - 3.87 mg/dL 5.64  Sodium 332 - 951 mEq/L 138  Potassium 3.5 - 5.3 mEq/L 4.6  Chloride 96 - 112 mEq/L 106  CO2 19 - 32 mEq/L 24  Calcium 8.4 - 10.5 mg/dL 9.3  Total Protein 6.0 - 8.3 g/dL 7.3  Total Bilirubin 0.3 - 1.2 mg/dL 0.3  Alkaline Phos 39 - 117 U/L 46  AST 0 - 37 U/L 15  ALT 0 - 35 U/L 12    Discharge instruction: per After Visit Summary and "Baby and Me Booklet".  After visit meds:    Medication List    STOP taking these medications   levonorgestrel-ethinyl estradiol 0.1-20 MG-MCG tablet Commonly known as:  AVIANE,ALESSE,LESSINA   multivitamin with minerals Tabs tablet     TAKE these medications   albuterol 108 (90 Base) MCG/ACT inhaler Commonly known as:  PROVENTIL HFA;VENTOLIN HFA Inhale 2 puffs  into the lungs every 6 (six) hours as needed for wheezing or shortness of breath.   albuterol (5 MG/ML) 0.5% nebulizer solution Commonly known as:  PROVENTIL Take 0.5 mLs (2.5 mg total) by nebulization every 6 (six) hours as needed for wheezing or shortness of breath.   fexofenadine 180 MG tablet Commonly known as:  ALLEGRA Take 180 mg by mouth daily as needed (for allergies).   Fluticasone-Salmeterol 250-50 MCG/DOSE Aepb Commonly known as:  ADVAIR DISKUS Inhale 1 puff into the lungs 2 (two) times daily.   ibuprofen 800 MG tablet Commonly known as:  ADVIL,MOTRIN Take 1 tablet (800 mg total) by mouth every 8 (eight) hours as needed.   levalbuterol 45 MCG/ACT inhaler Commonly known as:  XOPENEX HFA Inhale 1-2 puffs into the lungs every 6 (six) hours as needed for wheezing.   mometasone 50 MCG/ACT nasal spray Commonly known as:  NASONEX Place 2 sprays into the nose daily.   multivitamin-prenatal 27-0.8 MG Tabs tablet Take 1 tablet by mouth daily at 12 noon.   oxyCODONE 5 MG immediate release tablet Commonly known as:  Oxy IR/ROXICODONE Take 1-2 po q hr  prn severe pain   pirbuterol 200 MCG/INH inhaler Commonly known as:  MAXAIR Inhale 2 puffs into the lungs 4 (four) times daily as needed for wheezing or shortness of breath.   thiamine 100 MG tablet Take 100 mg by mouth daily.   Vitamin D (Ergocalciferol) 50000 units Caps capsule Commonly known as:  DRISDOL Take 50,000 Units by mouth every 7 (seven) days. Taken on Wednesdays.       Diet: routine diet  Activity: Advance as tolerated. Pelvic rest for 6 weeks.   Outpatient follow up:2 weeks and 6 weeks Follow up Appt:No future appointments. Follow up Visit:No Follow-up on file.  [facility]  Newborn Data: Live born female  Birth Weight: 6 lb 15 oz (3147 g) APGAR: 8, 9  Baby Feeding: Breast Disposition:home with mother   11/28/2015 Sherian ReinBovard-Stuckert, Vearl Allbaugh, MD

## 2015-12-05 ENCOUNTER — Ambulatory Visit (HOSPITAL_COMMUNITY)
Admit: 2015-12-05 | Discharge: 2015-12-05 | Disposition: A | Discharge status: Home / Self Care | Payer: BC Managed Care – PPO | Attending: Obstetrics and Gynecology | Admitting: Obstetrics and Gynecology

## 2015-12-05 NOTE — Lactation Note (Signed)
Lactation Consult:  weight today 6- 12.2 oz 3068 g with clean dry diaper. Mom reports breast feeding is going well, doing better each day. Baby had fed about 1 hour before coming to appointment. Baby latched well and lots of swallows noted initially then baby getting sleepy and mostly non nutritive. Good weight gain since last Ped visit . No pain with latch Mom reports her hands are still bothering her- carpal tunnel. Was able to get in comfortable position here in football hold.Encouragement given. Reviewed [facility] as another resource for support. No further questions at present. To call prn  Mother's reason for visit:  To make sure breastfeeding is going well Visit Type:  Feeding assessment Appointment Notes: SN in hospital. 8 % wt loss Consult:  Initial Lactation Consultant:  Audry RilesWeeks, Dalasia Predmore D  ________________________________________________________________________  Baby's Name:  Sara Newton Date of Birth:  11/25/2017 Pediatrician:  Sheliah HatchWarner Gender:  female Gestational Age: 4128w1d (At Birth) Birth Weight:  6 lb 15 oz (3147 g) Weight at Discharge:  Weight: 6 lb 6 oz (2892 g)                  Date of Discharge:  11/28/2015      [facility]   [facility] 0000 11/26/15 2350 11/28/15 0030  Weight: 6 lb 15.5 oz (3160 g) 6 lb 11.2 oz (3040 g) 6 lb 6 oz (2892 g)     ________________________________________________________________________  Mother's Name: Sara Newton Breastfeeding Experience:  P1 Maternal Medical Conditions: carpal tunnel in hands  ___________________________________________________  Breastfeeding History (Post Discharge)  Frequency of breastfeeding:  q 2-3 hours Duration of feeding:  15-20 min    Pumping  Type of pump:  Medela pump in style Frequency:  occassionally Volume: 1-2 oz   Infant Intake and Output Assessment  Voids:  10+ in 24 hrs.  Color:  Clear yellow Stools:  10+ in 24 hrs.  Color:   Yellow  ________________________________________________________________________  Maternal Breast Assessment  Breast:  Filling Nipple:  Erect  _______________________________________________________________________ Feeding Assessment/Evaluation  Initial feeding assessment:  Infant's oral assessment:  WNL  Positioning:  Football Left breast  LATCH documentation:  Latch:  2 = Grasps breast easily, tongue down, lips flanged, rhythmical sucking.  Audible swallowing:  1 = A few with stimulation  Type of nipple:  2 = Everted at rest and after stimulation  Comfort (Breast/Nipple):  2 = Soft / non-tender  Hold (Positioning):  2 = No assistance needed to correctly position infant at breast  LATCH score:  9  Attached assessment:  Deep  Lips flanged:  Yes.    Lips untucked:  No.  Suck assessment:  Nutritive and Nonnutritive  Pre-feed weight:  3068 g 6- 12.2 oz Post-feed weight:  3090 g  6- 13.0 oz Amount transferred:  22 ml Amount supplemented:  0 ml   Total amount pumped post feed:mom did not bring pump with her Total amount transferred:  22 ml

## 2016-06-02 ENCOUNTER — Ambulatory Visit (INDEPENDENT_AMBULATORY_CARE_PROVIDER_SITE_OTHER): Payer: BC Managed Care – PPO | Admitting: Orthopaedic Surgery

## 2016-06-02 DIAGNOSIS — M654 Radial styloid tenosynovitis [de Quervain]: Secondary | ICD-10-CM | POA: Insufficient documentation

## 2016-06-02 MED ORDER — METHYLPREDNISOLONE ACETATE 40 MG/ML IJ SUSP
40.0000 mg | INTRAMUSCULAR | Status: AC | PRN
  Administered 2016-06-02: 40 mg

## 2016-06-02 NOTE — Progress Notes (Signed)
Office Visit Note   Patient: Sara Newton           Date of Birth: Jul 15, 1980           MRN: [id number] Visit Date: 06/02/2016              Requested by: No referring provider defined for this encounter. PCP: No PCP Per Patient   Assessment & Plan: Visit Diagnoses:  1. De Quervain's disease (radial styloid tenosynovitis)     Plan: Her findings are consistent with de Quervain's tenosynovitis. I showed her some Internet sites to look at treatment modalities for this. I talked her about trying a steroid injection in the first dorsal compartment and she is agreeable with this. She tolerated the injection well. I will have her take Aleve for just a week. Will see her back in about 2 months to see how she is doing overall. We can also try a thumb spica wrist splint from here or over-the-counter.  Follow-Up Instructions: Return in about 2 months (around 08/02/2016).   Orders:  No orders of the defined types were placed in this encounter.  No orders of the defined types were placed in this encounter.     Procedures: Hand/UE Inj Date/Time: 06/02/2016 9:01 AM Performed by: Kathryne HitchBLACKMAN, Deandria Klute Y Authorized by: Kathryne HitchBLACKMAN, Arcenia Scarbro Y   Condition: de Quervain's   Site:  R extensor compartment 1 Medications:  40 mg methylPREDNISolone acetate 40 MG/ML     Clinical Data: No additional findings.   Subjective: No chief complaint on file. The patient is a very pleasant right-hand-dominant female who reports right wrist pain since she's had a new baby 6 months ago. She points the radial styloid as source for pain. She denies any numbness and tingling in her fingers or thumb. She denies any injuries. She does work as a Customer service managerprofessor and a physicist. She denies any other injuries as a relates to her right wrist pain.  HPI  Review of Systems She denies any fever, chills, nausea, vomiting, chest pain, headache, shortness of breath  Objective: Vital Signs: There were no vitals taken  for this visit.  Physical Exam She is alert and oriented 3 and in no acute distress Ortho Exam Examination of her right upper extremity shows pain over the radial styloid. She has a positive Finkelstein test. She is neurovascularly intact. She has excellent pinch and grip strength and full range of motion of her thumb fingers and hand. She has palpable pulses in her wrist. Specialty Comments:  No specialty comments available.  Imaging: No results found.   PMFS History: Patient Active Problem List   Diagnosis Date Noted  . De Quervain's disease (radial styloid tenosynovitis) 06/02/2016  . S/P cesarean section 11/28/2015  . Active labor at term 11/25/2015  . Hypovitaminosis D 02/21/2012  . BV (bacterial vaginosis) 02/21/2012  . Chest pain 02/21/2012   Past Medical History:  Diagnosis Date  . Allergy   . Asthma   . S/P cesarean section 11/28/2015    Family History  Problem Relation Age of Onset  . Hypertension Mother   . Hypertension Father   . Dementia Paternal Grandfather   . Hypertension Sister     Past Surgical History:  Procedure Laterality Date  . CESAREAN SECTION N/A 11/25/2015   Procedure: CESAREAN SECTION;  Surgeon: Lavina Hammanodd Meisinger, MD;  [facility];  Service: Obstetrics;  Laterality: N/A;  . NO PAST SURGERIES     Social History   Occupational History  . Physics  Professor    Social History Main Topics  . Smoking status: Never Smoker  . Smokeless tobacco: Never Used  . Alcohol use Yes     Comment: socially  . Drug use: No  . Sexual activity: Not Currently

## 2016-08-11 ENCOUNTER — Ambulatory Visit (INDEPENDENT_AMBULATORY_CARE_PROVIDER_SITE_OTHER): Payer: BC Managed Care – PPO | Admitting: Orthopaedic Surgery

## 2016-08-11 DIAGNOSIS — M654 Radial styloid tenosynovitis [de Quervain]: Secondary | ICD-10-CM

## 2016-08-11 MED ORDER — LIDOCAINE HCL 1 % IJ SOLN
1.0000 mL | INTRAMUSCULAR | Status: AC | PRN
  Administered 2016-08-11: 1 mL

## 2016-08-11 MED ORDER — METHYLPREDNISOLONE ACETATE 40 MG/ML IJ SUSP
40.0000 mg | INTRAMUSCULAR | Status: AC | PRN
  Administered 2016-08-11: 40 mg

## 2016-08-11 NOTE — Progress Notes (Signed)
Office Visit Note   Patient: Sara Newton           Date of Birth: 1980/05/10           MRN: [id number] Visit Date: 08/11/2016              Requested by: No referring provider defined for this encounter. PCP: Patient, No Pcp Per   Assessment & Plan: Visit Diagnoses:  1. De Quervain's disease (radial styloid tenosynovitis)     Plan: I explained in detail again the risk and benefits of injections and the fact that she should be careful with repetitive activities and try accommodation of ice, heat, and anti-inflammatories. All questions were encouraged and answered. She tolerated the injection well. She'll follow up as needed.  Follow-Up Instructions: Return if symptoms worsen or fail to improve.   Orders:  No orders of the defined types were placed in this encounter.  No orders of the defined types were placed in this encounter.     Procedures: Hand/UE Inj Date/Time: 08/11/2016 8:33 AM Performed by: Kathryne HitchBLACKMAN, Katiria Calame Y Authorized by: Kathryne HitchBLACKMAN, Linnie Mcglocklin Y   Condition: de Quervain's   Site:  R extensor compartment 1 Medications:  1 mL lidocaine 1 %; 40 mg methylPREDNISolone acetate 40 MG/ML     Clinical Data: No additional findings.   Subjective: No chief complaint on file. The patient has a history of de Quervain's tenosynovitis of her right wrist. We injected this wrist back in March. She says the injection was helpful but she like to have at least repeat injection today to help her symptoms to completely resolve 100%. She's been doing a lot of repetitive activities and her pain is not nearly like it was in the past.  HPI  Review of Systems She denies any headache, chest pain, shortness of breath, fever, chills, nausea, vomiting. She denies any other illnesses as relates to her chief complaint of right wrist pain.  Objective: Vital Signs: There were no vitals taken for this visit.  Physical Exam On examination she does have some pain over the radial  styloid and a positive Finkelstein test over her right side. She's neurovascularly intact. Remainder physical exam of the right upper extremity is normal. Ortho Exam  Specialty Comments:  No specialty comments available.  Imaging: No results found.   PMFS History: Patient Active Problem List   Diagnosis Date Noted  . De Quervain's disease (radial styloid tenosynovitis) 06/02/2016  . S/P cesarean section 11/28/2015  . Active labor at term 11/25/2015  . Hypovitaminosis D 02/21/2012  . BV (bacterial vaginosis) 02/21/2012  . Chest pain 02/21/2012   Past Medical History:  Diagnosis Date  . Allergy   . Asthma   . S/P cesarean section 11/28/2015    Family History  Problem Relation Age of Onset  . Hypertension Mother   . Hypertension Father   . Dementia Paternal Grandfather   . Hypertension Sister     Past Surgical History:  Procedure Laterality Date  . CESAREAN SECTION N/A 11/25/2015   Procedure: CESAREAN SECTION;  Surgeon: Lavina Hammanodd Meisinger, MD;  [facility];  Service: Obstetrics;  Laterality: N/A;  . NO PAST SURGERIES     Social History   Occupational History  . Physics Professor    Social History Main Topics  . Smoking status: Never Smoker  . Smokeless tobacco: Never Used  . Alcohol use Yes     Comment: socially  . Drug use: No  . Sexual activity: Not Currently

## 2016-09-22 ENCOUNTER — Other Ambulatory Visit (INDEPENDENT_AMBULATORY_CARE_PROVIDER_SITE_OTHER): Payer: Self-pay | Admitting: Orthopaedic Surgery

## 2020-04-25 ENCOUNTER — Other Ambulatory Visit: Payer: Self-pay | Admitting: Obstetrics and Gynecology

## 2020-04-25 DIAGNOSIS — R928 Other abnormal and inconclusive findings on diagnostic imaging of breast: Secondary | ICD-10-CM

## 2020-05-09 ENCOUNTER — Ambulatory Visit
Admission: RE | Admit: 2020-05-09 | Discharge: 2020-05-09 | Disposition: A | Discharge status: Home / Self Care | Payer: BC Managed Care – PPO | Source: Ambulatory Visit | Attending: Obstetrics and Gynecology | Admitting: Obstetrics and Gynecology

## 2020-05-09 ENCOUNTER — Other Ambulatory Visit: Payer: Self-pay

## 2020-05-09 DIAGNOSIS — R928 Other abnormal and inconclusive findings on diagnostic imaging of breast: Secondary | ICD-10-CM

## 2021-08-19 ENCOUNTER — Ambulatory Visit (INDEPENDENT_AMBULATORY_CARE_PROVIDER_SITE_OTHER): Payer: BC Managed Care – PPO | Admitting: Internal Medicine

## 2021-08-19 ENCOUNTER — Encounter: Payer: Self-pay | Admitting: Internal Medicine

## 2021-08-19 VITALS — BP 120/80 | HR 81 | Temp 98.1°F | Resp 18 | Ht 63.5 in | Wt 168.9 lb

## 2021-08-19 DIAGNOSIS — J3089 Other allergic rhinitis: Secondary | ICD-10-CM

## 2021-08-19 DIAGNOSIS — L301 Dyshidrosis [pompholyx]: Secondary | ICD-10-CM | POA: Diagnosis not present

## 2021-08-19 DIAGNOSIS — J4530 Mild persistent asthma, uncomplicated: Secondary | ICD-10-CM

## 2021-08-19 MED ORDER — MOMETASONE FUROATE 50 MCG/ACT NA SUSP: 2.0000 | Freq: Every day | NASAL | 12 refills | Status: AC

## 2021-08-19 MED ORDER — TRIAMCINOLONE ACETONIDE 0.1 % EX OINT: TOPICAL_OINTMENT | CUTANEOUS | 1 refills | Status: AC

## 2021-08-19 NOTE — Patient Instructions (Signed)
Mild Persistent  Asthma: well controlled  - Breathing test today showed: looked great!  - Based on symptoms and breathing tests your asthma is well controlled    PLAN:  . - Daily controller medication(s): None needed at this time  - Prior to physical activity: albuterol 2 puffs 10-15 minutes before physical activity. - Rescue medications: albuterol nebulizer one vial every 4-6 hours as needed - Get Influenza Vaccine and appropriate Pneumonia and COVID 19 boosters  - Asthma control goals:  * Full participation in all desired activities (may need albuterol before activity) * Albuterol use two time or less a week on average (not counting use with activity) * Cough interfering with sleep two time or less a month * Oral steroids no more than once a year * No hospitalizations  Seasonal and perennial rhinitis: Not well controlled  - Testing today showed Positive grass, mugwort, trees, molds, dust mite, dog, horse - Copy of test results provided.  - Avoidance measures provided. - Continue with: Allegra (fexofenadine) 180mg  table once daily - Start taking: Nasacort (triamcinolone) one spray per nostril daily - You can use an extra dose of the antihistamine, if needed, for breakthrough symptoms.  - Consider nasal saline rinses 1-2 times daily to remove allergens from the nasal cavities as well as help with mucous clearance (this is especially helpful to do before the nasal sprays are given) - Consider allergy shots as a means of long-term control and can reduce lifetime use of medications  - Allergy shots "re-train" and "reset" the immune system to ignore environmental allergens and decrease the resulting immune response to those allergens (sneezing, itchy watery eyes, runny nose, nasal congestion, etc).    - Allergy shots improve symptoms in 75-85%  - Allergy shots are the only potential permanent and disease modifying option  - We can discuss more at the next appointment if the medications are  not working for you.   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  Don't hesitate to reach out if you have any additional concerns!  05-28-1995, MD  [facility] [place], [place]  Control of Mold Allergen   Mold and fungi can grow on a variety of surfaces provided certain temperature and moisture conditions exist.  Outdoor molds grow on plants, decaying vegetation and soil.  The major outdoor mold, Alternaria and Cladosporium, are found in very high numbers during hot and dry conditions.  Generally, a late Summer - Fall peak is seen for common outdoor fungal spores.  Rain will temporarily lower outdoor mold spore count, but counts rise rapidly when the rainy period ends.  The most important indoor molds are Aspergillus and Penicillium.  Dark, humid and poorly ventilated basements are ideal sites for mold growth.  The next most common sites of mold growth are the bathroom and the kitchen.  Outdoor (Seasonal) Mold Control  Positive outdoor molds via skin testing: Alternaria, Cladosporium, Bipolaris (Helminthsporium), Drechslera (Curvalaria), and Mucor  Use air conditioning and keep windows closed Avoid exposure to decaying vegetation. Avoid leaf raking. Avoid grain handling. Consider wearing  a face mask if working in moldy areas.    Indoor (Perennial) Mold Control   Positive indoor molds via skin testing: Aspergillus, Fusarium, Aureobasidium (Pullulara), Botrytis, Phoma, and Candida  Maintain humidity below 50%. Clean washable surfaces with 5% bleach solution. Remove sources  e.g. contaminated carpets.   Reducing Pollen Exposure  The American Academy of Allergy, Asthma and Immunology suggests the following steps to reduce your exposure to pollen during allergy seasons.    Do not hang sheets or clothing out to dry; pollen may collect on these items. Do not mow lawns or spend time around freshly cut grass; mowing stirs up pollen. Keep windows closed at night.  Keep car windows closed while driving. Minimize morning activities outdoors, a time when pollen counts are usually at their highest. Stay indoors as much as possible when pollen counts or humidity is high and on windy days when pollen tends to remain in the air longer. Use air conditioning when possible.  Many air conditioners have filters that trap the pollen spores. Use a HEPA room air filter to remove pollen form the indoor air you breathe.  DUST MITE AVOIDANCE MEASURES:  There are three main measures that need and can be taken to avoid house dust mites:  Reduce accumulation of dust in general -reduce furniture, clothing, carpeting, books, stuffed animals, especially in bedroom  Separate yourself from the dust -use pillow and mattress encasements (can be found at stores such as [employer] or online) -avoid direct exposure to air condition flow -use a HEPA filter device, especially in the bedroom; you can also use a HEPA filter vacuum cleaner -wipe dust with a moist towel instead of a dry towel or broom when cleaning  Decrease mites and/or their secretions -wash clothing and linen and stuffed animals at highest temperature possible, at least every 2 weeks -stuffed animals can also be placed in a bag and put in a freezer overnight  Despite the above measures, it is impossible to eliminate dust mites or their allergen completely from your home.  With the above measures the burden of mites in your home can be diminished, with the goal of minimizing your allergic symptoms.  Success will be  reached only when implementing and using all means together.

## 2021-08-19 NOTE — Progress Notes (Signed)
New Patient Note  RE: Sara Newton MRN: [id number] DOB: May 24, 1980 Date of Office Visit: 08/19/2021  Consult requested by: Renaye Rakers, MD Primary care provider: Patient, No Pcp Per (Inactive)  Chief Complaint: Asthma and Allergy Testing (Had blood test at pcp showed positive to 39 allergens )  History of Present Illness: I had the pleasure of seeing Kyle Stansell for initial evaluation at the [facility] on 08/19/2021. She is a 41 y.o. female, who is referred here by Patient, No Pcp Per (Inactive) for the evaluation of asthma and allergic rhinitis and dermatitis .  History obtained from patient .  Asthma: Diagnosed at age as a child .  Current symptoms include chest tightness, shortness of breath, and wheezing 1-2 daytime symptoms in past month, 0 nighttime awakenings in past month Using rescue inhaler no use in past 30 days, only uses nebulizer because HFAs don't help  Limitations to daily activity: mild 0 ED visits, 0 UC visits and 0 oral steroids in the past year 1 number of lifetime hospitalizations, 0 number of lifetime intubations.  Identified Triggers:  rhinitis, weather changes, exercise, respiratory illness, and cold air Prior PFTs or spirometry: None to review Previously used therapies: Advair 250, albuterol. Xopenex  Current regimen:  Maintenance: none current Rescue: Albuterol 2 puffs q4-6 hrs PRN, not using prior to exercise  Up-to-date with Covid-19, and Flu, vaccines. History of prior pneumonias: 0 History of prior COVID-19 infection: 0 Smoking exposure: Denies Today's Asthma Control Test:  .    Chronic rhinitis: started as a young child, but has progressively gotten worse since relocating to [place]  Symptoms include: nasal congestion, rhinorrhea, post nasal drainage, sneezing, watery eyes, itchy eyes, and itchy nose  Occurs  with weather changes  Potential triggers: weather changes, dogs, rats, cats  Treatments tried: Allegra 180 mg  daily, Nasonex, singulair ( caused a dry cough)  Previous allergy testing:  Blood work positive per patient - cannot review results History of reflux/heartburn: no History of chronic sinusitis or sinus surgery: no Nonallergic triggers:  Denies     Dermatitis   - Started about 6 years ago after birth of her son, occurs on inside of palms and sides of her fingers.  Associated with burning and itching -Previously prescribed triamcinolone and tacrolimus but did not use consistently due to fears of side effects - Has tried gloves with aloe, oatmeal and lavender which help.    Assessment and Plan: Lyzette is a 41 y.o. female with: Mild persistent asthma without complication - Plan: Spirometry with Graph  Other allergic rhinitis  Dyshidrotic eczema Plan: Patient Instructions  Mild Persistent  Asthma: well controlled  - Breathing test today showed: looked great!  - Based on symptoms and breathing tests your asthma is well controlled    PLAN:  . - Daily controller medication(s): None needed at this time  - Prior to physical activity: albuterol 2 puffs 10-15 minutes before physical activity. - Rescue medications: albuterol nebulizer one vial every 4-6 hours as needed - Get Influenza Vaccine and appropriate Pneumonia and COVID 19 boosters  - Asthma control goals:  * Full participation in all desired activities (may need albuterol before activity) * Albuterol use two time or less a week on average (not counting use with activity) * Cough interfering with sleep two time or less a month * Oral steroids no more than once a year * No hospitalizations  Seasonal and perennial rhinitis: Not well controlled  - Testing today showed  Positive grass, mugwort, trees, molds, dust mite, dog, horse - Copy of test results provided.  - Avoidance measures provided. - Continue with: Allegra (fexofenadine)  table once daily - Start taking: Nasacort (triamcinolone) one spray per nostril daily - You  can use an extra dose of the antihistamine, if needed, for breakthrough symptoms.  - Consider nasal saline rinses 1-2 times daily to remove allergens from the nasal cavities as well as help with mucous clearance (this is especially helpful to do before the nasal sprays are given) - Consider allergy shots as a means of long-term control and can reduce lifetime use of medications  - Allergy shots "re-train" and "reset" the immune system to ignore environmental allergens and decrease the resulting immune response to those allergens (sneezing, itchy watery eyes, runny nose, nasal congestion, etc).    - Allergy shots improve symptoms in 75-85%  - Allergy shots are the only potential permanent and disease modifying option  - We can discuss more at the next appointment if the medications are not working for you.   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  Don't hesitate to reach out if you have any additional concerns!  Ferol Luz, MD  [facility] [place] [place]  Control of Mold Allergen   Mold and fungi can grow on a variety of surfaces provided certain temperature and moisture conditions exist.  Outdoor molds grow on plants, decaying vegetation and soil.  The major outdoor mold, Alternaria and Cladosporium, are found in very high numbers during hot and dry conditions.  Generally, a late Summer - Fall peak is seen for common outdoor fungal spores.  Rain will temporarily lower outdoor mold spore count, but counts rise rapidly when the rainy period ends.  The most important indoor molds  are Aspergillus and Penicillium.  Dark, humid and poorly ventilated basements are ideal sites for mold growth.  The next most common sites of mold growth are the bathroom and the kitchen.  Outdoor (Seasonal) Mold Control  Positive outdoor molds via skin testing: Alternaria, Cladosporium, Bipolaris (Helminthsporium), Drechslera (Curvalaria), and Mucor  Use air conditioning and keep windows closed Avoid exposure to decaying vegetation. Avoid leaf raking. Avoid grain handling. Consider wearing a face mask if working in moldy areas.    Indoor (Perennial) Mold Control   Positive indoor molds via skin testing: Aspergillus, Fusarium, Aureobasidium (Pullulara), Botrytis, Phoma, and Candida  Maintain humidity below 50%. Clean washable surfaces with 5% bleach solution. Remove sources e.g. contaminated carpets.   Reducing Pollen Exposure  The American Academy of Allergy, Asthma and Immunology suggests the following steps to reduce your exposure to pollen during allergy seasons.    Do not hang sheets or clothing out to dry; pollen may collect on these items. Do not mow lawns or spend time around freshly cut grass; mowing stirs up pollen. Keep windows closed at night.  Keep car windows closed while driving. Minimize morning activities outdoors, a time when pollen counts are usually at their highest. Stay indoors as much as possible when pollen counts or humidity is high and  on windy days when pollen tends to remain in the air longer. Use air conditioning when possible.  Many air conditioners have filters that trap the pollen spores. Use a HEPA room air filter to remove pollen form the indoor air you breathe.  DUST MITE AVOIDANCE MEASURES:  There are three main measures that need and can be taken to avoid house dust mites:  Reduce accumulation of dust in general -reduce furniture, clothing, carpeting, books, stuffed animals, especially in bedroom  Separate yourself from the dust -use  pillow and mattress encasements (can be found at stores such as [employer] or online) -avoid direct exposure to air condition flow -use a HEPA filter device, especially in the bedroom; you can also use a HEPA filter vacuum cleaner -wipe dust with a moist towel instead of a dry towel or broom when cleaning  Decrease mites and/or their secretions -wash clothing and linen and stuffed animals at highest temperature possible, at least every 2 weeks -stuffed animals can also be placed in a bag and put in a freezer overnight  Despite the above measures, it is impossible to eliminate dust mites or their allergen completely from your home.  With the above measures the burden of mites in your home can be diminished, with the goal of minimizing your allergic symptoms.  Success will be reached only when implementing and using all means together.      No follow-ups on file.  Meds ordered this encounter  Medications   triamcinolone ointment (KENALOG) 0.1 %    Sig: Apply topically twice daily to BODY as needed for red, sandpaper like rash.  Do not use on face, groin or armpits.    Dispense:  80 g    Refill:  1   mometasone (NASONEX) 50 MCG/ACT nasal spray    Sig: Place 2 sprays into the nose daily.    Dispense:  1 each    Refill:  12   Lab Orders  No laboratory test(s) ordered today    Other allergy screening: Asthma: yes Rhino conjunctivitis: yes Food allergy: no Medication allergy: no Hymenoptera allergy: no Urticaria: no Eczema:yes History of recurrent infections suggestive of immunodeficency: no  Diagnostics: Spirometry:  Tracings reviewed. Her effort: Good reproducible efforts. FVC: 2.79 L FEV1: 2.30 L, 91% predicted FEV1/FVC ratio: 82% Interpretation: Spirometry consistent with normal pattern.  Please see scanned spirometry results for details.  Skin Testing: Environmental allergy panel. Positive grass, mugwort, trees, molds, dust mite, dog, horse Results  interpreted by myself and discussed with patient/family.  Airborne Adult Perc - 08/19/21 0955     Time Antigen Placed 0355    Allergen Manufacturer Waynette Buttery    Location Back    Number of Test 59    1. Control-Buffer 50% Glycerol Negative    2. Control-Histamine 1 mg/ml 4+    3. Albumin saline Negative    4. Bahia 4+    5. [place] 4+    6. Johnson 4+    7. Kentucky Blue 4+    8. Meadow Fescue 4+    9. Perennial Rye 4+    10. Sweet Vernal 4+    11. Timothy 4+    12. Cocklebur Negative    13. Burweed Marshelder Negative    14. Ragweed, short Negative    15. Ragweed, Giant Negative    16. Plantain,  English Negative    17. Lamb's Quarters Negative    18. Sheep Sorrell Negative    19. Rough Pigweed Negative  20. Marsh Elder, Rough Negative    21. Mugwort, Common 3+    22. Ash mix 4+    23. Birch mix 4+    24. Beech American 4+    25. Box, Elder Negative    26. Cedar, red 4+    27. Cottonwood, Guinea-BissauEastern Negative    28. Elm mix Negative    29. Hickory 4+    30. Maple mix Negative    31. Oak, Guinea[place]Biss[place]astern mix 4+    32. Pecan Pollen 4+    33. Pine mix Negative    34. Sycamore Eastern Negative    35. Walnut, Black Pollen 4+    36. Alternaria alternata 4+    37. Cladosporium Herbarum 3+    38. Aspergillus mix 3+    39. Penicillium mix Negative    40. Bipolaris sorokiniana (Helminthosporium) 4+    41. Drechslera spicifera (Curvularia) 3+    42. Mucor plumbeus 3+    43. Fusarium moniliforme 4+    44. Aureobasidium pullulans (pullulara) 3+    45. Rhizopus oryzae Negative    46. Botrytis cinera 3+    47. Epicoccum nigrum Negative    48. Phoma betae 3+    49. Candida Albicans 4+    50. Trichophyton mentagrophytes Negative    51. Mite, D Farinae  5,000 AU/ml 3+    52. Mite, D Pteronyssinus  5,000 AU/ml 3+    53. Cat Hair 10,000 BAU/ml Negative    54.  Dog Epithelia 3+    55. Mixed Feathers Negative    56. Horse Epithelia 4+    57. Cockroach, German Negative    58. Mouse  Negative    59. Tobacco Leaf Negative             Past Medical History: Patient Active Problem List   Diagnosis Date Noted   Tommi RumpsDe Quervain's disease (radial styloid tenosynovitis) 06/02/2016   S/P cesarean section 11/28/2015   Active labor at term 11/25/2015   Hypovitaminosis D 02/21/2012   BV (bacterial vaginosis) 02/21/2012   Chest pain 02/21/2012   Past Medical History:  Diagnosis Date   Allergy    Asthma    S/P cesarean section 11/28/2015   Past Surgical History: Past Surgical History:  Procedure Laterality Date   CESAREAN SECTION N/A 11/25/2015   Procedure: CESAREAN SECTION;  Surgeon: Lavina Hammanodd Meisinger, MD;  [facility];  Service: Obstetrics;  Laterality: N/A;   NO PAST SURGERIES     Medication List:  Current Outpatient Medications  Medication Sig Dispense Refill   albuterol (PROVENTIL HFA;VENTOLIN HFA) 108 (90 BASE) MCG/ACT inhaler Inhale 2 puffs into the lungs every 6 (six) hours as needed for wheezing or shortness of breath. 1 Inhaler 6   albuterol (PROVENTIL) (5 MG/ML) 0.5% nebulizer solution Take 0.5 mLs (2.5 mg total) by nebulization every 6 (six) hours as needed for wheezing or shortness of breath. 50 mL 11   fexofenadine (ALLEGRA) 180 MG tablet Take 180 mg by mouth daily as needed (for allergies).      ibuprofen (ADVIL,MOTRIN) 800 MG tablet Take 1 tablet (800 mg total) by mouth every 8 (eight) hours as needed. 45 tablet 1   mometasone (NASONEX) 50 MCG/ACT nasal spray Place 2 sprays into the nose daily. 1 each 12   Prenatal Vit-Fe Fumarate-FA (MULTIVITAMIN-PRENATAL) 27-0.8 MG TABS tablet Take 1 tablet by mouth daily at 12 noon. 45 tablet 1   triamcinolone ointment (KENALOG) 0.1 % Apply topically twice daily to BODY as needed for  red, sandpaper like rash.  Do not use on face, groin or armpits. 80 g 1   No current facility-administered medications for this visit.   Allergies: Allergies  Allergen Reactions   Tomato Hives   Social History: Social  History   Socioeconomic History   Marital status: Single    Spouse name: Not on file   Number of children: Not on file   Years of education: Not on file   Highest education level: Not on file  Occupational History   Occupation: Physics Professor  Tobacco Use   Smoking status: Never   Smokeless tobacco: Never  Substance and Sexual Activity   Alcohol use: Yes    Comment: socially   Drug use: No   Sexual activity: Not Currently  Other Topics Concern   Not on file  Social History Narrative   Not on file   Social Determinants of Health   Financial Resource Strain: Not on file  Food Insecurity: Not on file  Transportation Needs: Not on file  Physical Activity: Not on file  Stress: Not on file  Social Connections: Not on file   Lives in a single-family home that is 43 years old.  There are no roaches in the house and bed is not 2 feet off the floor.  She does use dust mite precautions on bed and pillows.  She does have a HEPA filter and [place]  She is exposed to fumes, chemicals and dust in her job. Smoking: No exposure Occupation: Works as a Airline pilot, works with rats in a Hydrologist.  Does have significant handwashing throughout the day  Environmental History: Water Damage/mildew in the house: no Carpet in the family room: no Carpet in the bedroom: yes Heating: gas Cooling: central Pet: no  Family History: Family History  Problem Relation Age of Onset   Hypertension Mother    Hypertension Father    Dementia Paternal Grandfather    Hypertension Sister      ROS: All others negative except as noted per HPI.   Objective: BP 120/80   Pulse 81   Temp 98.1 F (36.7 C) (Temporal)   Resp 18   Ht 5' 3.5" (1.613 m)   Wt 168 lb 14.4 oz (76.6 kg)   SpO2 98%   BMI 29.45 kg/m  Body mass index is 29.45 kg/m.  General Appearance:  Alert, cooperative, no distress, appears stated age  Head:  Normocephalic, without obvious  abnormality, atraumatic  Eyes:  Conjunctiva clear, EOM's intact  Nose: Nares normal, hypertrophic turbinates, no visible anterior polyps, and septum midline  Throat: Lips, tongue normal; teeth and gums normal, normal posterior oropharynx and no tonsillar exudate  Neck: Supple, symmetrical  Lungs:   clear to auscultation bilaterally, Respirations unlabored, no coughing  Heart:  regular rate and rhythm and no murmur, Appears well perfused  Extremities: No edema  Skin: Skin color, texture, turgor normal, no rashes or lesions on visualized portions of skin  Neurologic: No gross deficits   The plan was reviewed with the patient/family, and all questions/concerned were addressed.  It was my pleasure to see Langston today and participate in her care. Please feel free to contact me with any questions or concerns.  Sincerely,  Ferol Luz, MD Allergy & Immunology  [facility] office: [phone] [facility] office: [phone]

## 2021-11-19 ENCOUNTER — Ambulatory Visit: Payer: BC Managed Care – PPO | Admitting: Internal Medicine

## 2022-04-17 IMAGING — MG MM DIGITAL DIAGNOSTIC UNILAT*L* W/ TOMO W/ CAD
4 series · 4 of 12 positions shown · non-contrast
Comparison: Previous exam(s).

CLINICAL DATA: Screening recall for a possible left breast mass.

EXAM:
DIGITAL DIAGNOSTIC UNILATERAL LEFT MAMMOGRAM WITH TOMOSYNTHESIS AND
CAD; ULTRASOUND LEFT BREAST LIMITED
TECHNIQUE: Left digital diagnostic mammography and breast tomosynthesis was
performed. The images were evaluated with computer-aided detection.;
Targeted ultrasound examination of the left breast was performed

[L CC synth-2D]
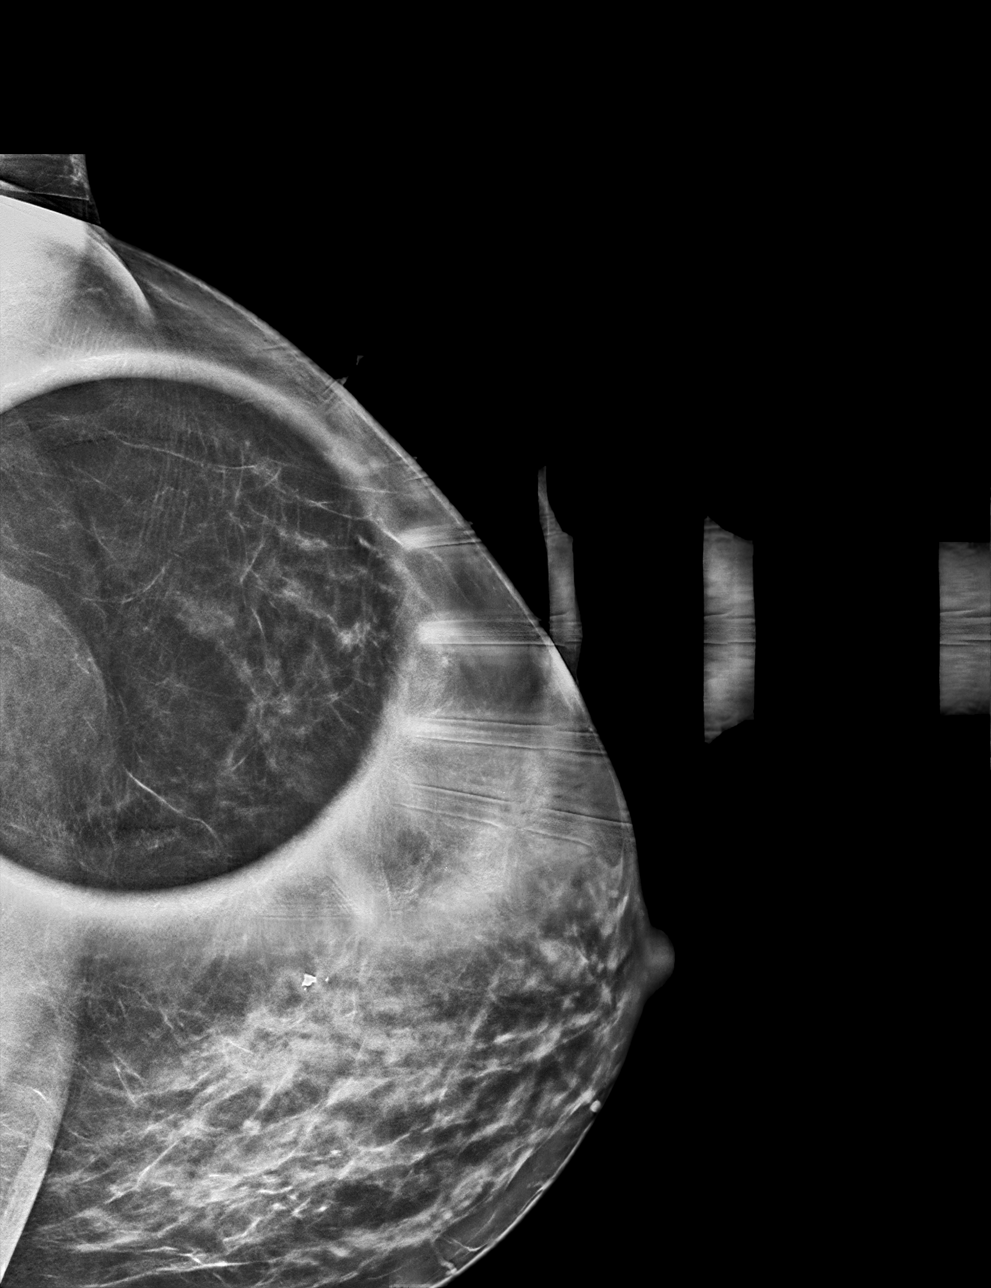

[L MLO synth-2D]
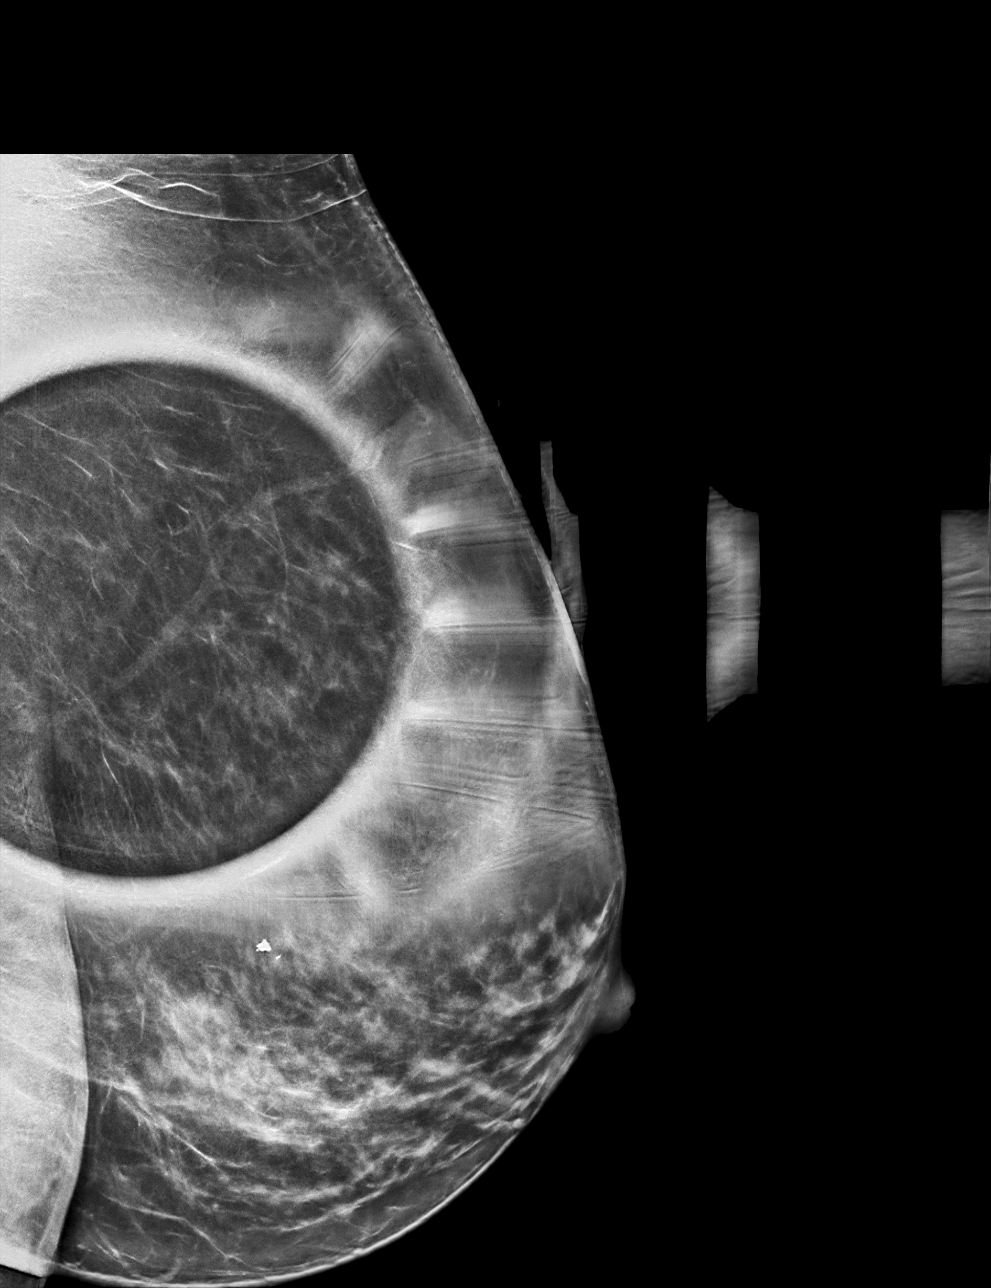

[L MLO tomo · tomo slice 35/69.0]
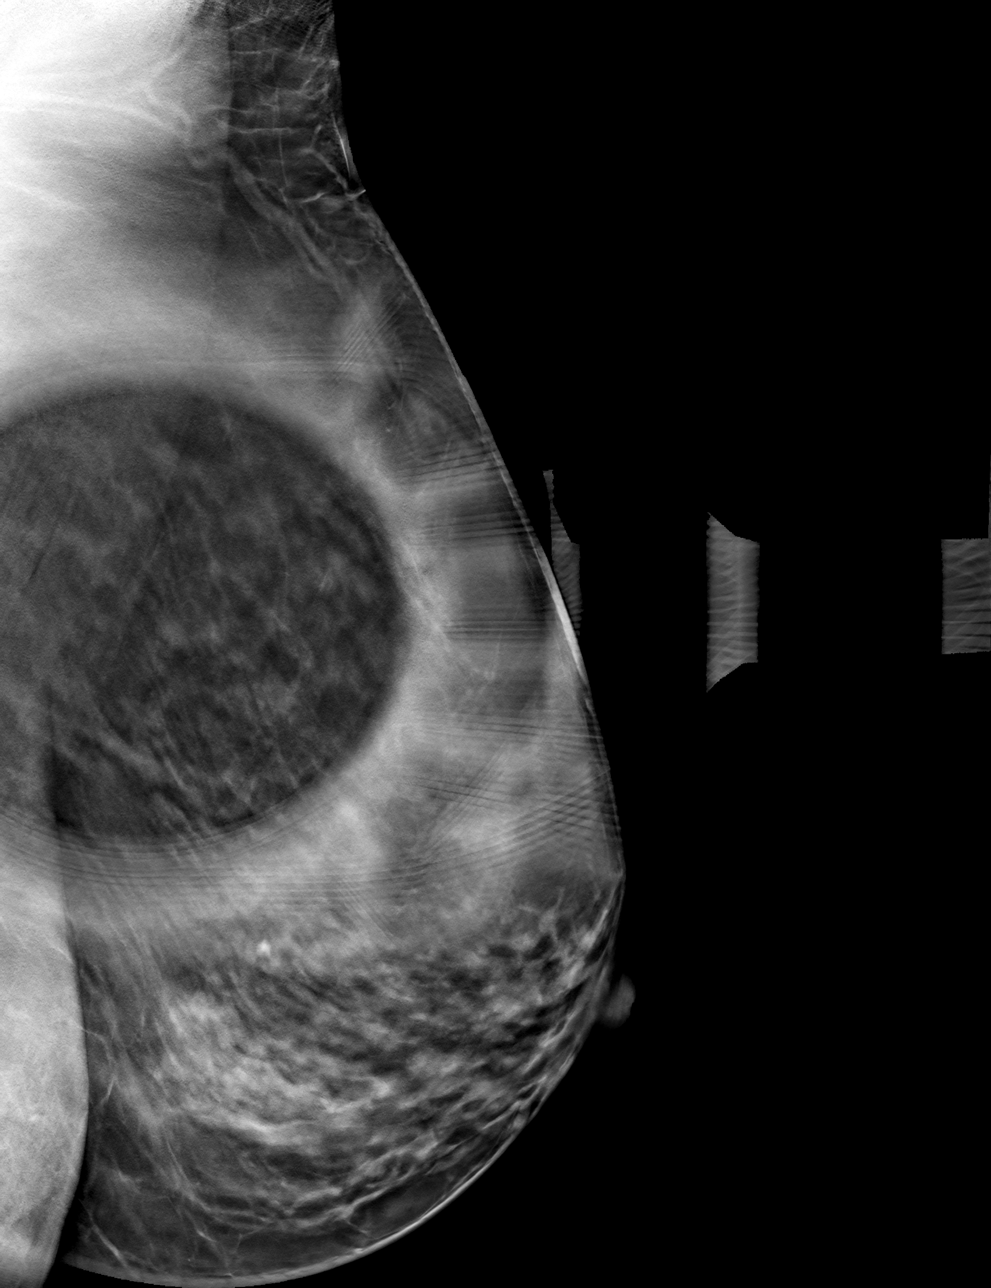

[L CC tomo · tomo slice 35/69.0]
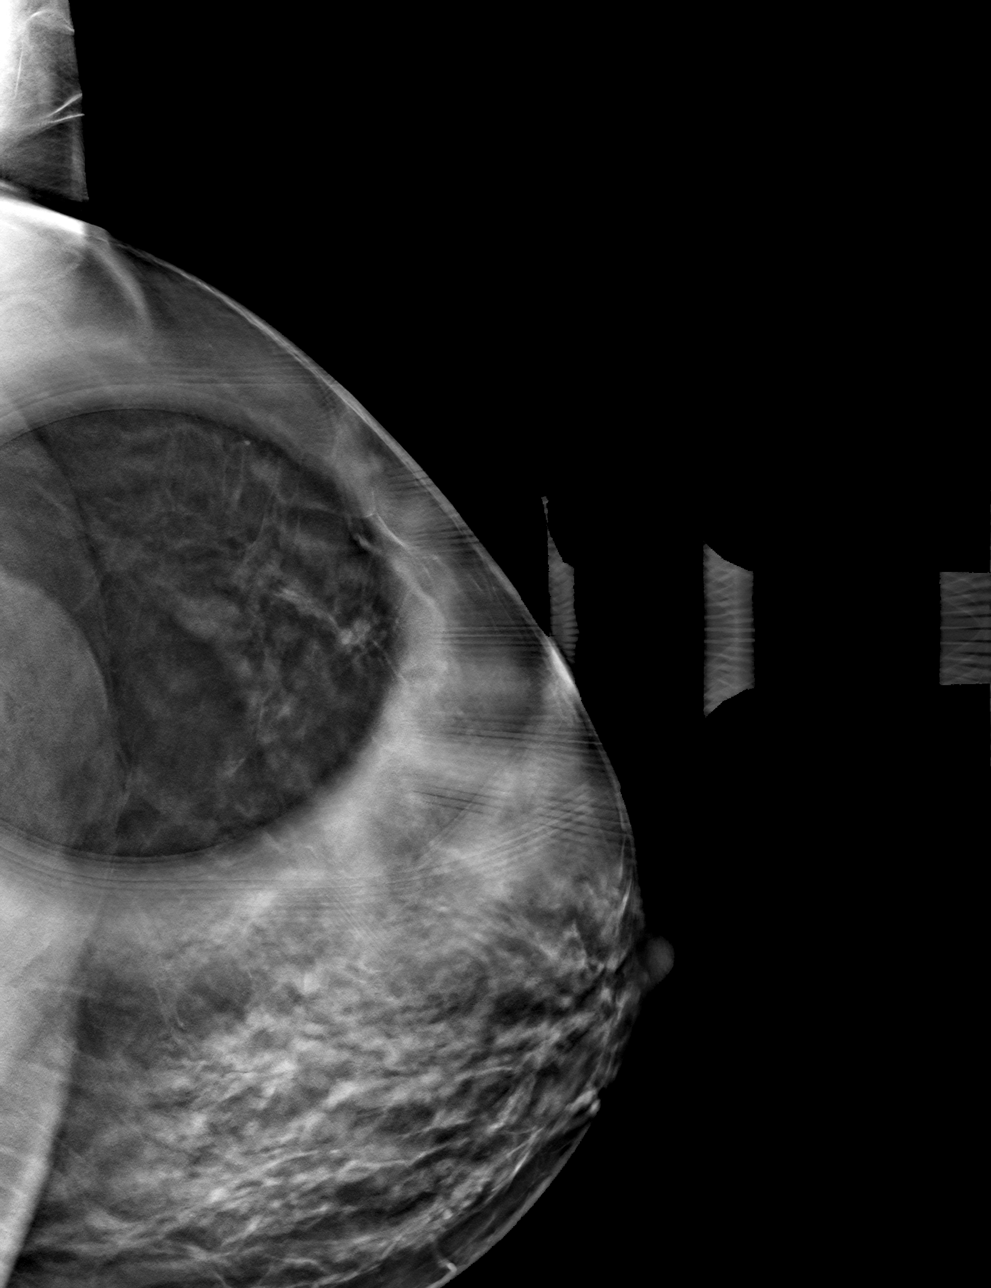

[4 of 12 positions shown; findings below may reference images not displayed]

ACR Breast Density Category c: The breast tissue is heterogeneously
dense, which may obscure small masses.
FINDINGS: The possible mass noted in the posterior, upper outer left breast on
the current screening study disperses on the spot-compression MLO
images and mostly disperses on the spot compression cc images.

There are no areas of architectural distortion. There is no definite
mass and there are no suspicious calcifications.

Targeted ultrasound is performed, showing normal fibroglandular
tissue throughout the upper outer and lateral left breast. No mass
or suspicious lesion.
IMPRESSION: 1. No evidence of breast malignancy.

RECOMMENDATION:
Screening mammogram in one year.(Code:ME-R-777)

I have discussed the findings and recommendations with the patient.
If applicable, a reminder letter will be sent to the patient
regarding the next appointment.

BI-RADS CATEGORY  1: Negative.

## 2022-04-17 IMAGING — US US BREAST*L* LIMITED INC AXILLA
1 series · 2 of 2 positions shown · non-contrast
Comparison: Previous exam(s).

CLINICAL DATA: Screening recall for a possible left breast mass.

EXAM:
DIGITAL DIAGNOSTIC UNILATERAL LEFT MAMMOGRAM WITH TOMOSYNTHESIS AND
CAD; ULTRASOUND LEFT BREAST LIMITED
TECHNIQUE: Left digital diagnostic mammography and breast tomosynthesis was
performed. The images were evaluated with computer-aided detection.;
Targeted ultrasound examination of the left breast was performed

[Series 2: us breast*left* limited inc axilla · 0.07mm/px · 2 of 2 slices shown]
[im 1/2]
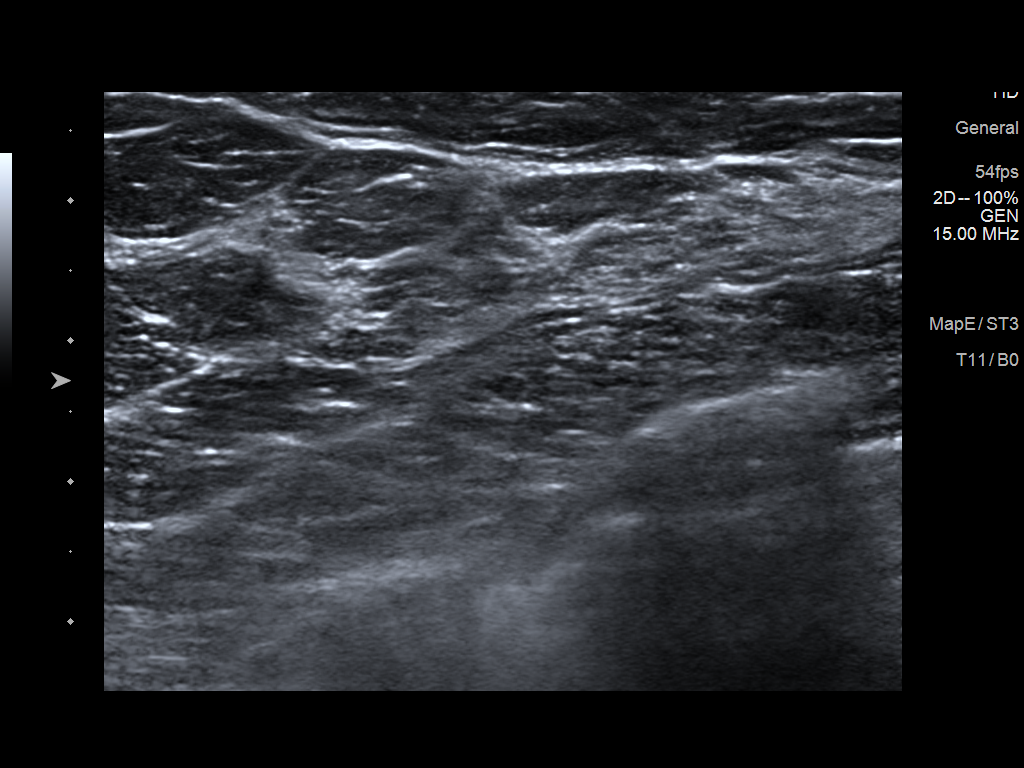
[im 2/2]
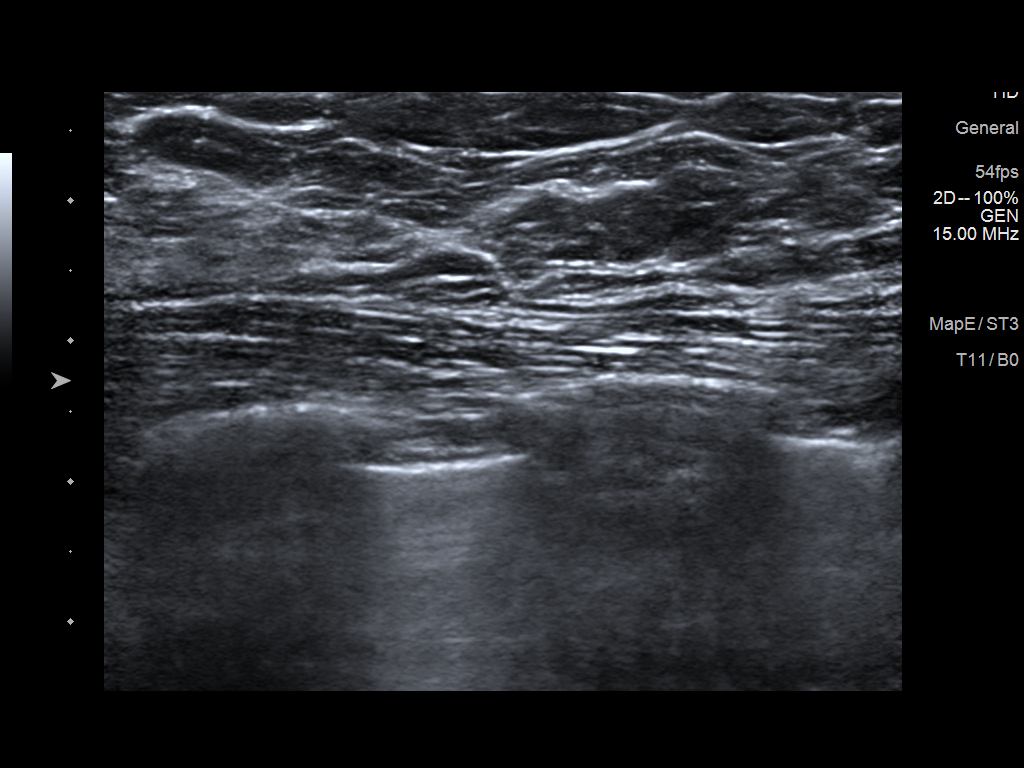

[2 of 2 positions shown; findings below may reference images not displayed]

ACR Breast Density Category c: The breast tissue is heterogeneously
dense, which may obscure small masses.
FINDINGS: The possible mass noted in the posterior, upper outer left breast on
the current screening study disperses on the spot-compression MLO
images and mostly disperses on the spot compression cc images.

There are no areas of architectural distortion. There is no definite
mass and there are no suspicious calcifications.

Targeted ultrasound is performed, showing normal fibroglandular
tissue throughout the upper outer and lateral left breast. No mass
or suspicious lesion.
IMPRESSION: 1. No evidence of breast malignancy.

RECOMMENDATION:
Screening mammogram in one year.(Code:ME-R-777)

I have discussed the findings and recommendations with the patient.
If applicable, a reminder letter will be sent to the patient
regarding the next appointment.

BI-RADS CATEGORY  1: Negative.
